# Patient Record
Sex: Female | Born: 1955
Health system: Southern US, Community
[De-identification: ages and names within clinical notes are randomized; demographics above are authoritative.]

## PROBLEM LIST (undated history)

## (undated) DIAGNOSIS — I493 Ventricular premature depolarization: Secondary | ICD-10-CM

## (undated) DIAGNOSIS — G4700 Insomnia, unspecified: Secondary | ICD-10-CM

## (undated) DIAGNOSIS — G43909 Migraine, unspecified, not intractable, without status migrainosus: Secondary | ICD-10-CM

## (undated) DIAGNOSIS — I471 Supraventricular tachycardia, unspecified: Secondary | ICD-10-CM

## (undated) DIAGNOSIS — K5190 Ulcerative colitis, unspecified, without complications: Secondary | ICD-10-CM

## (undated) DIAGNOSIS — I351 Nonrheumatic aortic (valve) insufficiency: Secondary | ICD-10-CM

## (undated) DIAGNOSIS — M810 Age-related osteoporosis without current pathological fracture: Secondary | ICD-10-CM

## (undated) HISTORY — DX: Ventricular premature depolarization: I49.3

## (undated) HISTORY — DX: Nonrheumatic aortic (valve) insufficiency: I35.1

## (undated) HISTORY — DX: Supraventricular tachycardia: I47.1

## (undated) HISTORY — PX: TUBAL LIGATION: SHX77

## (undated) HISTORY — DX: Supraventricular tachycardia, unspecified: I47.10

## (undated) HISTORY — DX: Insomnia, unspecified: G47.00

## (undated) HISTORY — DX: Migraine, unspecified, not intractable, without status migrainosus: G43.909

---

## 2013-02-24 ENCOUNTER — Ambulatory Visit (HOSPITAL_BASED_OUTPATIENT_CLINIC_OR_DEPARTMENT_OTHER)
Admission: RE | Admit: 2013-02-24 | Discharge: 2013-02-24 | Disposition: A | Discharge status: Home / Self Care | Payer: 59 | Source: Ambulatory Visit | Attending: Family Medicine | Admitting: Family Medicine

## 2013-02-24 ENCOUNTER — Other Ambulatory Visit (HOSPITAL_BASED_OUTPATIENT_CLINIC_OR_DEPARTMENT_OTHER): Payer: Self-pay | Admitting: Family Medicine

## 2013-02-24 DIAGNOSIS — R209 Unspecified disturbances of skin sensation: Secondary | ICD-10-CM | POA: Insufficient documentation

## 2013-02-24 DIAGNOSIS — M542 Cervicalgia: Secondary | ICD-10-CM | POA: Insufficient documentation

## 2013-06-18 ENCOUNTER — Other Ambulatory Visit: Payer: Self-pay | Admitting: Family Medicine

## 2013-06-18 ENCOUNTER — Other Ambulatory Visit: Payer: Self-pay

## 2013-06-18 DIAGNOSIS — M8580 Other specified disorders of bone density and structure, unspecified site: Secondary | ICD-10-CM

## 2013-06-18 DIAGNOSIS — Z1231 Encounter for screening mammogram for malignant neoplasm of breast: Secondary | ICD-10-CM

## 2013-07-09 ENCOUNTER — Ambulatory Visit: Payer: 59

## 2013-07-09 ENCOUNTER — Other Ambulatory Visit: Payer: 59

## 2013-07-22 ENCOUNTER — Ambulatory Visit
Admission: RE | Admit: 2013-07-22 | Discharge: 2013-07-22 | Disposition: A | Discharge status: Home / Self Care | Payer: 59 | Source: Ambulatory Visit | Attending: Family Medicine | Admitting: Family Medicine

## 2013-07-22 ENCOUNTER — Ambulatory Visit
Admission: RE | Admit: 2013-07-22 | Discharge: 2013-07-22 | Disposition: A | Discharge status: Home / Self Care | Payer: 59 | Source: Ambulatory Visit

## 2013-07-22 DIAGNOSIS — Z1231 Encounter for screening mammogram for malignant neoplasm of breast: Secondary | ICD-10-CM

## 2013-07-22 DIAGNOSIS — M8580 Other specified disorders of bone density and structure, unspecified site: Secondary | ICD-10-CM

## 2014-08-27 ENCOUNTER — Other Ambulatory Visit (HOSPITAL_BASED_OUTPATIENT_CLINIC_OR_DEPARTMENT_OTHER): Payer: Self-pay | Admitting: Family Medicine

## 2014-08-27 DIAGNOSIS — Z0000 Encounter for general adult medical examination without abnormal findings: Secondary | ICD-10-CM

## 2014-08-31 ENCOUNTER — Ambulatory Visit (HOSPITAL_BASED_OUTPATIENT_CLINIC_OR_DEPARTMENT_OTHER)
Admission: RE | Admit: 2014-08-31 | Discharge: 2014-08-31 | Disposition: A | Discharge status: Home / Self Care | Payer: 59 | Source: Ambulatory Visit | Attending: Family Medicine | Admitting: Family Medicine

## 2014-08-31 DIAGNOSIS — Z1231 Encounter for screening mammogram for malignant neoplasm of breast: Secondary | ICD-10-CM | POA: Insufficient documentation

## 2014-08-31 DIAGNOSIS — Z0000 Encounter for general adult medical examination without abnormal findings: Secondary | ICD-10-CM

## 2015-08-11 ENCOUNTER — Other Ambulatory Visit (HOSPITAL_BASED_OUTPATIENT_CLINIC_OR_DEPARTMENT_OTHER): Payer: Self-pay | Admitting: Family Medicine

## 2015-08-11 DIAGNOSIS — Z139 Encounter for screening, unspecified: Secondary | ICD-10-CM

## 2015-09-06 ENCOUNTER — Ambulatory Visit (HOSPITAL_BASED_OUTPATIENT_CLINIC_OR_DEPARTMENT_OTHER)
Admission: RE | Admit: 2015-09-06 | Discharge: 2015-09-06 | Disposition: A | Discharge status: Home / Self Care | Payer: 59 | Source: Ambulatory Visit | Attending: Family Medicine | Admitting: Family Medicine

## 2015-09-06 DIAGNOSIS — Z1231 Encounter for screening mammogram for malignant neoplasm of breast: Secondary | ICD-10-CM | POA: Insufficient documentation

## 2015-09-06 DIAGNOSIS — Z139 Encounter for screening, unspecified: Secondary | ICD-10-CM

## 2016-01-20 MED FILL — ALENDRONATE NA 70 MG TAB: 70 | 84 days supply | Qty: 12 | Fill #3

## 2016-01-26 MED FILL — valACYclovir HCL 1 GM TABS: 1 | 30 days supply | Qty: 30 | Fill #1

## 2016-04-13 MED FILL — ALENDRONATE NA 70 MG TAB: 70 | 84 days supply | Qty: 12 | Fill #0

## 2016-05-18 DIAGNOSIS — Z23 Encounter for immunization: Secondary | ICD-10-CM | POA: Diagnosis not present

## 2016-05-18 MED FILL — ATOVAQUONE-PROGUANIL 250-10: 250-100 | 19 days supply | Qty: 19 | Fill #0

## 2016-05-29 DIAGNOSIS — Z0000 Encounter for general adult medical examination without abnormal findings: Secondary | ICD-10-CM | POA: Diagnosis not present

## 2016-06-06 DIAGNOSIS — N393 Stress incontinence (female) (male): Secondary | ICD-10-CM | POA: Insufficient documentation

## 2016-06-06 DIAGNOSIS — J309 Allergic rhinitis, unspecified: Secondary | ICD-10-CM | POA: Insufficient documentation

## 2016-06-06 DIAGNOSIS — E782 Mixed hyperlipidemia: Secondary | ICD-10-CM | POA: Diagnosis not present

## 2016-06-06 DIAGNOSIS — E559 Vitamin D deficiency, unspecified: Secondary | ICD-10-CM | POA: Insufficient documentation

## 2016-06-06 DIAGNOSIS — Z0000 Encounter for general adult medical examination without abnormal findings: Secondary | ICD-10-CM | POA: Diagnosis not present

## 2016-06-06 DIAGNOSIS — M810 Age-related osteoporosis without current pathological fracture: Secondary | ICD-10-CM | POA: Insufficient documentation

## 2016-06-06 DIAGNOSIS — K5100 Ulcerative (chronic) pancolitis without complications: Secondary | ICD-10-CM | POA: Insufficient documentation

## 2016-06-06 DIAGNOSIS — K5190 Ulcerative colitis, unspecified, without complications: Secondary | ICD-10-CM | POA: Insufficient documentation

## 2016-06-22 ENCOUNTER — Other Ambulatory Visit (HOSPITAL_BASED_OUTPATIENT_CLINIC_OR_DEPARTMENT_OTHER): Payer: Self-pay | Admitting: Family Medicine

## 2016-06-22 DIAGNOSIS — M810 Age-related osteoporosis without current pathological fracture: Secondary | ICD-10-CM

## 2016-06-22 DIAGNOSIS — Z1231 Encounter for screening mammogram for malignant neoplasm of breast: Secondary | ICD-10-CM

## 2016-07-10 MED FILL — ALENDRONATE NA 70 MG TAB: 70 | 84 days supply | Qty: 12 | Fill #1

## 2016-08-02 DIAGNOSIS — Z1211 Encounter for screening for malignant neoplasm of colon: Secondary | ICD-10-CM | POA: Diagnosis not present

## 2016-08-02 DIAGNOSIS — K5100 Ulcerative (chronic) pancolitis without complications: Secondary | ICD-10-CM | POA: Diagnosis not present

## 2016-08-08 MED FILL — MESALAMINE DR 1.2 GM TABLET: 1.2 | 30 days supply | Qty: 60 | Fill #0

## 2016-08-09 MED FILL — valACYclovir HCL 1 GM TABS: 1 | 30 days supply | Qty: 30 | Fill #0

## 2016-09-07 ENCOUNTER — Other Ambulatory Visit (HOSPITAL_BASED_OUTPATIENT_CLINIC_OR_DEPARTMENT_OTHER): Payer: 59

## 2016-09-07 ENCOUNTER — Ambulatory Visit (HOSPITAL_BASED_OUTPATIENT_CLINIC_OR_DEPARTMENT_OTHER): Payer: 59

## 2016-09-08 ENCOUNTER — Other Ambulatory Visit (HOSPITAL_BASED_OUTPATIENT_CLINIC_OR_DEPARTMENT_OTHER): Payer: 59

## 2016-09-08 ENCOUNTER — Ambulatory Visit (HOSPITAL_BASED_OUTPATIENT_CLINIC_OR_DEPARTMENT_OTHER): Payer: 59

## 2016-09-11 ENCOUNTER — Ambulatory Visit (HOSPITAL_BASED_OUTPATIENT_CLINIC_OR_DEPARTMENT_OTHER)
Admission: RE | Admit: 2016-09-11 | Discharge: 2016-09-11 | Disposition: A | Discharge status: Home / Self Care | Payer: 59 | Source: Ambulatory Visit | Attending: Family Medicine | Admitting: Family Medicine

## 2016-09-11 DIAGNOSIS — M85852 Other specified disorders of bone density and structure, left thigh: Secondary | ICD-10-CM | POA: Insufficient documentation

## 2016-09-11 DIAGNOSIS — Z780 Asymptomatic menopausal state: Secondary | ICD-10-CM | POA: Insufficient documentation

## 2016-09-11 DIAGNOSIS — Z1231 Encounter for screening mammogram for malignant neoplasm of breast: Secondary | ICD-10-CM | POA: Diagnosis not present

## 2016-09-11 DIAGNOSIS — Z87891 Personal history of nicotine dependence: Secondary | ICD-10-CM | POA: Diagnosis not present

## 2016-09-11 DIAGNOSIS — M8588 Other specified disorders of bone density and structure, other site: Secondary | ICD-10-CM | POA: Diagnosis not present

## 2016-09-11 DIAGNOSIS — M810 Age-related osteoporosis without current pathological fracture: Secondary | ICD-10-CM | POA: Diagnosis not present

## 2016-09-13 MED FILL — MESALAMINE DR 1.2 GM TABLET: 1.2 | 30 days supply | Qty: 60 | Fill #1

## 2016-10-04 DIAGNOSIS — Z23 Encounter for immunization: Secondary | ICD-10-CM | POA: Diagnosis not present

## 2016-10-05 DIAGNOSIS — H524 Presbyopia: Secondary | ICD-10-CM | POA: Diagnosis not present

## 2016-10-05 DIAGNOSIS — H52223 Regular astigmatism, bilateral: Secondary | ICD-10-CM | POA: Diagnosis not present

## 2016-10-09 MED FILL — SCOPOLAMINE 1 MG/3 DAY PATC: 1 | 30 days supply | Qty: 10 | Fill #0

## 2016-10-09 MED FILL — ALENDRONATE NA 70 MG TAB: 70 | 84 days supply | Qty: 12 | Fill #2

## 2016-10-19 MED FILL — MESALAMINE DR 1.2G TABLET: 1.2 | 30 days supply | Qty: 60 | Fill #2

## 2016-11-14 MED FILL — MESALAMINE DR 1.2G TABLET: 1.2 | 90 days supply | Qty: 180 | Fill #0

## 2017-01-17 MED FILL — ALENDRONATE NA 70 MG TAB: 70 | 84 days supply | Qty: 12 | Fill #3

## 2017-04-17 MED FILL — ALENDRONATE NA 70 MG TAB: 70 | 84 days supply | Qty: 12 | Fill #0

## 2017-04-26 MED FILL — SUPREP BOWEL PREP KIT: 17.5-3.13-1 | 2 days supply | Qty: 354 | Fill #0

## 2017-05-07 DIAGNOSIS — K639 Disease of intestine, unspecified: Secondary | ICD-10-CM | POA: Diagnosis not present

## 2017-05-07 DIAGNOSIS — K6389 Other specified diseases of intestine: Secondary | ICD-10-CM | POA: Diagnosis not present

## 2017-05-07 DIAGNOSIS — K648 Other hemorrhoids: Secondary | ICD-10-CM | POA: Diagnosis not present

## 2017-05-07 DIAGNOSIS — K629 Disease of anus and rectum, unspecified: Secondary | ICD-10-CM | POA: Diagnosis not present

## 2017-05-07 DIAGNOSIS — Z8371 Family history of colonic polyps: Secondary | ICD-10-CM | POA: Diagnosis not present

## 2017-05-07 DIAGNOSIS — K6289 Other specified diseases of anus and rectum: Secondary | ICD-10-CM | POA: Diagnosis not present

## 2017-05-07 DIAGNOSIS — K5730 Diverticulosis of large intestine without perforation or abscess without bleeding: Secondary | ICD-10-CM | POA: Diagnosis not present

## 2017-05-07 DIAGNOSIS — K5190 Ulcerative colitis, unspecified, without complications: Secondary | ICD-10-CM | POA: Diagnosis not present

## 2017-05-07 DIAGNOSIS — Z1211 Encounter for screening for malignant neoplasm of colon: Secondary | ICD-10-CM | POA: Diagnosis not present

## 2017-06-04 DIAGNOSIS — Z0000 Encounter for general adult medical examination without abnormal findings: Secondary | ICD-10-CM | POA: Diagnosis not present

## 2017-06-11 DIAGNOSIS — Z79899 Other long term (current) drug therapy: Secondary | ICD-10-CM | POA: Diagnosis not present

## 2017-06-11 DIAGNOSIS — Z124 Encounter for screening for malignant neoplasm of cervix: Secondary | ICD-10-CM | POA: Diagnosis not present

## 2017-06-11 DIAGNOSIS — Z1151 Encounter for screening for human papillomavirus (HPV): Secondary | ICD-10-CM | POA: Diagnosis not present

## 2017-06-11 DIAGNOSIS — E559 Vitamin D deficiency, unspecified: Secondary | ICD-10-CM | POA: Diagnosis not present

## 2017-06-11 DIAGNOSIS — B009 Herpesviral infection, unspecified: Secondary | ICD-10-CM | POA: Diagnosis not present

## 2017-06-11 DIAGNOSIS — M810 Age-related osteoporosis without current pathological fracture: Secondary | ICD-10-CM | POA: Diagnosis not present

## 2017-06-11 DIAGNOSIS — E782 Mixed hyperlipidemia: Secondary | ICD-10-CM | POA: Diagnosis not present

## 2017-06-11 DIAGNOSIS — M25512 Pain in left shoulder: Secondary | ICD-10-CM | POA: Diagnosis not present

## 2017-06-11 DIAGNOSIS — K5100 Ulcerative (chronic) pancolitis without complications: Secondary | ICD-10-CM | POA: Diagnosis not present

## 2017-06-11 DIAGNOSIS — Z0000 Encounter for general adult medical examination without abnormal findings: Secondary | ICD-10-CM | POA: Diagnosis not present

## 2017-07-16 MED FILL — ALENDRONATE NA 70 MG TAB: 70 | 84 days supply | Qty: 12 | Fill #1

## 2017-08-29 ENCOUNTER — Other Ambulatory Visit (HOSPITAL_BASED_OUTPATIENT_CLINIC_OR_DEPARTMENT_OTHER): Payer: Self-pay | Admitting: Family Medicine

## 2017-08-29 DIAGNOSIS — Z1231 Encounter for screening mammogram for malignant neoplasm of breast: Secondary | ICD-10-CM

## 2017-09-13 ENCOUNTER — Ambulatory Visit (HOSPITAL_BASED_OUTPATIENT_CLINIC_OR_DEPARTMENT_OTHER)
Admission: RE | Admit: 2017-09-13 | Discharge: 2017-09-13 | Disposition: A | Discharge status: Home / Self Care | Payer: 59 | Source: Ambulatory Visit | Attending: Family Medicine | Admitting: Family Medicine

## 2017-09-13 DIAGNOSIS — Z1231 Encounter for screening mammogram for malignant neoplasm of breast: Secondary | ICD-10-CM | POA: Insufficient documentation

## 2017-09-14 ENCOUNTER — Emergency Department (HOSPITAL_BASED_OUTPATIENT_CLINIC_OR_DEPARTMENT_OTHER): Payer: 59

## 2017-09-14 ENCOUNTER — Observation Stay (HOSPITAL_BASED_OUTPATIENT_CLINIC_OR_DEPARTMENT_OTHER): Payer: 59

## 2017-09-14 ENCOUNTER — Encounter (HOSPITAL_BASED_OUTPATIENT_CLINIC_OR_DEPARTMENT_OTHER): Payer: Self-pay | Admitting: *Deleted

## 2017-09-14 ENCOUNTER — Observation Stay (HOSPITAL_COMMUNITY): Payer: 59

## 2017-09-14 ENCOUNTER — Observation Stay (HOSPITAL_BASED_OUTPATIENT_CLINIC_OR_DEPARTMENT_OTHER)
Admission: EM | Admit: 2017-09-14 | Discharge: 2017-09-14 | Disposition: A | Discharge status: Home / Self Care | Payer: 59 | Attending: Family Medicine | Admitting: Family Medicine

## 2017-09-14 ENCOUNTER — Other Ambulatory Visit: Payer: Self-pay | Admitting: Cardiology

## 2017-09-14 DIAGNOSIS — Z7982 Long term (current) use of aspirin: Secondary | ICD-10-CM | POA: Insufficient documentation

## 2017-09-14 DIAGNOSIS — G459 Transient cerebral ischemic attack, unspecified: Secondary | ICD-10-CM

## 2017-09-14 DIAGNOSIS — R7303 Prediabetes: Secondary | ICD-10-CM | POA: Insufficient documentation

## 2017-09-14 DIAGNOSIS — Z79899 Other long term (current) drug therapy: Secondary | ICD-10-CM | POA: Diagnosis not present

## 2017-09-14 DIAGNOSIS — Z882 Allergy status to sulfonamides status: Secondary | ICD-10-CM | POA: Diagnosis not present

## 2017-09-14 DIAGNOSIS — E785 Hyperlipidemia, unspecified: Secondary | ICD-10-CM

## 2017-09-14 DIAGNOSIS — M4802 Spinal stenosis, cervical region: Secondary | ICD-10-CM | POA: Insufficient documentation

## 2017-09-14 DIAGNOSIS — E876 Hypokalemia: Secondary | ICD-10-CM | POA: Diagnosis not present

## 2017-09-14 DIAGNOSIS — I672 Cerebral atherosclerosis: Secondary | ICD-10-CM | POA: Insufficient documentation

## 2017-09-14 DIAGNOSIS — M810 Age-related osteoporosis without current pathological fracture: Secondary | ICD-10-CM | POA: Diagnosis not present

## 2017-09-14 DIAGNOSIS — I6521 Occlusion and stenosis of right carotid artery: Secondary | ICD-10-CM | POA: Diagnosis not present

## 2017-09-14 DIAGNOSIS — G43809 Other migraine, not intractable, without status migrainosus: Secondary | ICD-10-CM | POA: Insufficient documentation

## 2017-09-14 DIAGNOSIS — Z823 Family history of stroke: Secondary | ICD-10-CM | POA: Insufficient documentation

## 2017-09-14 DIAGNOSIS — H538 Other visual disturbances: Secondary | ICD-10-CM | POA: Diagnosis not present

## 2017-09-14 DIAGNOSIS — Z87891 Personal history of nicotine dependence: Secondary | ICD-10-CM | POA: Insufficient documentation

## 2017-09-14 DIAGNOSIS — I351 Nonrheumatic aortic (valve) insufficiency: Secondary | ICD-10-CM

## 2017-09-14 DIAGNOSIS — R4701 Aphasia: Secondary | ICD-10-CM

## 2017-09-14 DIAGNOSIS — Z8719 Personal history of other diseases of the digestive system: Secondary | ICD-10-CM | POA: Insufficient documentation

## 2017-09-14 DIAGNOSIS — R410 Disorientation, unspecified: Secondary | ICD-10-CM | POA: Diagnosis not present

## 2017-09-14 HISTORY — DX: Ulcerative colitis, unspecified, without complications: K51.90

## 2017-09-14 HISTORY — DX: Age-related osteoporosis without current pathological fracture: M81.0

## 2017-09-14 LAB — CBC
HCT: 39.8 % (ref 36.0–46.0)
HCT: 41 % (ref 36.0–46.0)
HEMOGLOBIN: 13.4 g/dL (ref 12.0–15.0)
HEMOGLOBIN: 13.6 g/dL (ref 12.0–15.0)
MCH: 30.5 pg (ref 26.0–34.0)
MCH: 30.6 pg (ref 26.0–34.0)
MCHC: 33.2 g/dL (ref 30.0–36.0)
MCHC: 33.7 g/dL (ref 30.0–36.0)
MCV: 90.7 fL (ref 78.0–100.0)
MCV: 92.3 fL (ref 78.0–100.0)
PLATELETS: 198 10*3/uL (ref 150–400)
Platelets: 220 10*3/uL (ref 150–400)
RBC: 4.39 MIL/uL (ref 3.87–5.11)
RBC: 4.44 MIL/uL (ref 3.87–5.11)
RDW: 13.2 % (ref 11.5–15.5)
RDW: 13.4 % (ref 11.5–15.5)
WBC: 5.7 10*3/uL (ref 4.0–10.5)
WBC: 7.6 10*3/uL (ref 4.0–10.5)

## 2017-09-14 LAB — RAPID URINE DRUG SCREEN, HOSP PERFORMED
AMPHETAMINES: NOT DETECTED
BENZODIAZEPINES: NOT DETECTED
Barbiturates: NOT DETECTED
Cocaine: NOT DETECTED
OPIATES: NOT DETECTED
TETRAHYDROCANNABINOL: NOT DETECTED

## 2017-09-14 LAB — COMPREHENSIVE METABOLIC PANEL
ALK PHOS: 45 U/L (ref 38–126)
ALT: 22 U/L (ref 14–54)
AST: 29 U/L (ref 15–41)
Albumin: 5 g/dL (ref 3.5–5.0)
Anion gap: 8 (ref 5–15)
BUN: 15 mg/dL (ref 6–20)
CHLORIDE: 101 mmol/L (ref 101–111)
CO2: 28 mmol/L (ref 22–32)
Calcium: 9.6 mg/dL (ref 8.9–10.3)
Creatinine, Ser: 0.61 mg/dL (ref 0.44–1.00)
GFR calc Af Amer: >60 mL/min (ref >60)
GFR calc non Af Amer: >60 mL/min (ref >60)
GLUCOSE: 103 mg/dL — AB (ref 65–99)
POTASSIUM: 3.3 mmol/L — AB (ref 3.5–5.1)
Sodium: 137 mmol/L (ref 135–145)
Total Bilirubin: 0.7 mg/dL (ref 0.3–1.2)
Total Protein: 7.3 g/dL (ref 6.5–8.1)

## 2017-09-14 LAB — LIPID PANEL
CHOLESTEROL: 196 mg/dL (ref 0–200)
HDL: 76 mg/dL (ref >40)
LDL Cholesterol: 107 mg/dL — ABNORMAL HIGH (ref 0–99)
TRIGLYCERIDES: 64 mg/dL (ref <150)
Total CHOL/HDL Ratio: 2.6 RATIO
VLDL: 13 mg/dL (ref 0–40)

## 2017-09-14 LAB — DIFFERENTIAL
Basophils Absolute: 0 10*3/uL (ref 0.0–0.1)
Basophils Relative: 0 %
EOS PCT: 1 %
Eosinophils Absolute: 0.1 10*3/uL (ref 0.0–0.7)
LYMPHS ABS: 3.9 10*3/uL (ref 0.7–4.0)
LYMPHS PCT: 51 %
MONOS PCT: 6 %
Monocytes Absolute: 0.5 10*3/uL (ref 0.1–1.0)
NEUTROS PCT: 42 %
Neutro Abs: 3.2 10*3/uL (ref 1.7–7.7)

## 2017-09-14 LAB — PROTIME-INR
INR: 0.93
Prothrombin Time: 12.4 seconds (ref 11.4–15.2)

## 2017-09-14 LAB — URINALYSIS, ROUTINE W REFLEX MICROSCOPIC
BILIRUBIN URINE: NEGATIVE
Glucose, UA: NEGATIVE mg/dL
HGB URINE DIPSTICK: NEGATIVE
KETONES UR: NEGATIVE mg/dL
NITRITE: NEGATIVE
PH: 7 (ref 5.0–8.0)
Protein, ur: NEGATIVE mg/dL

## 2017-09-14 LAB — HEMOGLOBIN A1C
HEMOGLOBIN A1C: 5.9 % — AB (ref 4.8–5.6)
MEAN PLASMA GLUCOSE: 122.63 mg/dL

## 2017-09-14 LAB — TROPONIN I: Troponin I: <0.03 ng/mL (ref <0.03)

## 2017-09-14 LAB — URINALYSIS, MICROSCOPIC (REFLEX): RBC / HPF: NONE SEEN RBC/hpf (ref 0–5)

## 2017-09-14 LAB — CREATININE, SERUM
Creatinine, Ser: 0.6 mg/dL (ref 0.44–1.00)
GFR calc non Af Amer: >60 mL/min (ref >60)

## 2017-09-14 LAB — ECHOCARDIOGRAM COMPLETE
Height: 62.5 in
WEIGHTICAEL: 1664 [oz_av]

## 2017-09-14 LAB — ETHANOL: Alcohol, Ethyl (B): <10 mg/dL (ref <10)

## 2017-09-14 LAB — HIV ANTIBODY (ROUTINE TESTING W REFLEX): HIV Screen 4th Generation wRfx: NONREACTIVE

## 2017-09-14 LAB — TSH: TSH: 0.947 u[IU]/mL (ref 0.350–4.500)

## 2017-09-14 LAB — APTT: aPTT: 33 seconds (ref 24–36)

## 2017-09-14 MED ORDER — ACETAMINOPHEN 325 MG PO TABS: 650.0000 mg | ORAL_TABLET | ORAL | Status: DC | PRN

## 2017-09-14 MED ORDER — ENOXAPARIN SODIUM 30 MG/0.3ML ~~LOC~~ SOLN
30.0000 mg | SUBCUTANEOUS | Status: DC
  Filled 2017-09-14: qty 0.3

## 2017-09-14 MED ORDER — ASPIRIN 300 MG RE SUPP: 300.0000 mg | Freq: Every day | RECTAL | Status: DC

## 2017-09-14 MED ORDER — ACETAMINOPHEN 160 MG/5ML PO SOLN: 650.0000 mg | ORAL | Status: DC | PRN

## 2017-09-14 MED ORDER — POTASSIUM CHLORIDE CRYS ER 20 MEQ PO TBCR
40.0000 meq | EXTENDED_RELEASE_TABLET | Freq: Once | ORAL | Status: AC
  Administered 2017-09-14: 40 meq via ORAL
  Filled 2017-09-14: qty 2

## 2017-09-14 MED ORDER — ASPIRIN EC 81 MG PO TBEC: 81.0000 mg | DELAYED_RELEASE_TABLET | Freq: Every day | ORAL | 0 refills | Status: AC

## 2017-09-14 MED ORDER — ATORVASTATIN CALCIUM 20 MG PO TABS: 20.0000 mg | ORAL_TABLET | Freq: Every day | ORAL | Status: DC

## 2017-09-14 MED ORDER — STROKE: EARLY STAGES OF RECOVERY BOOK
Freq: Once | Status: DC
  Filled 2017-09-14: qty 1

## 2017-09-14 MED ORDER — ACETAMINOPHEN 650 MG RE SUPP: 650.0000 mg | RECTAL | Status: DC | PRN

## 2017-09-14 MED ORDER — SODIUM CHLORIDE 0.9 % IV SOLN
INTRAVENOUS | Status: DC
  Administered 2017-09-14: 06:00:00 via INTRAVENOUS

## 2017-09-14 MED ORDER — ASPIRIN 325 MG PO TABS
325.0000 mg | ORAL_TABLET | Freq: Every day | ORAL | Status: DC
  Administered 2017-09-14: 325 mg via ORAL
  Filled 2017-09-14: qty 1

## 2017-09-14 MED ORDER — IOPAMIDOL (ISOVUE-370) INJECTION 76%
100.0000 mL | Freq: Once | INTRAVENOUS | Status: AC | PRN
  Administered 2017-09-14: 100 mL via INTRAVENOUS

## 2017-09-14 NOTE — Progress Notes (Signed)
PROGRESS NOTE    Samantha BorneRuth M Robeck  WUJ:[id number] DOB: 05/03/1956 DOA: 09/14/2017 PCP: Bosie Closice, Kathleen M, MD    Brief Narrative:  Samantha Matthews is Samantha Matthews 61 y.o. 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  This lasted for around 15 minutes.  Code stroke was called.  CT head was unremarkable.  This was followed by CT angiogram of the head and neck which also did not show any large vessel occlusion.  [facility]stroke workup.   Assessment & Plan:   Principal Problem:   TIA (transient ischemic attack)   1. TIA - sudden onset word finding difficulty and problems reading now resolved.  Sx c/w TIA.   1. MRI brain with chronic small vessel ischemic disease 2. 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  Also mod stenosis R P2 segment and mild cerebral artery atherosclerosis 3. Echo pending 4. Normal EEG 5. A1c 5.9, LDL 107 6. Neuro c/s, appreciate recs 1. ASA 325 at discharge 2. Atorvastatin 20 mg 3. Outpatient f/u neurology Darrol Angel(Carolyn Martin in 6 weeks) 4. Event monitor 30 days 2. Prediabetes: A1c 5.9, f/u with PCP 3. History of ulcerative colitis but off medications since patient's last colonoscopy was normal as per the patient and presently asymptomatic. 4. C3-4 and C4-5 neural foraminal narrowing per neurology. 5. Hypokalemia: replete, follow up mg    DVT prophylaxis: lovenox Code Status: full  Family Communication: husband at bedsied Disposition Plan: pending studies  Consultants:   neurology  Procedures: (Don't include imaging studies which can be auto populated. Include things that cannot be auto populated i.e. Echo,  Carotid and venous dopplers, Foley, Bipap, HD, tubes/drains, wound vac, central lines etc)  EEG normal  Echo pending  Antimicrobials: (specify start and planned stop date. Auto populated tables are space occupying and do not give end dates)  none    Subjective: Feeling normal. Notes sx started last night around MN.  30 min, difficulty with reading and word finding.  She's currently asx.   Objective: Vitals:   09/14/17 1030 09/14/17 1100 09/14/17 1200 09/14/17 1300  BP:  (!) 143/79 133/83 124/78  Pulse: 64 65 66 (!) 59  Resp: 20 18 20 17   Temp:      SpO2: 97% 97% 99% 97%  Weight:      Height:       No intake or output data in the 24 hours ending 09/14/17 1440 Filed Weights   09/14/17 0024  Weight: 47.2 kg (104 lb)    Examination:  General: No acute distress. Cardiovascular: Heart sounds show Alcide Memoli regular rate, and rhythm. No gallops or rubs. No murmurs. No JVD. Lungs: Clear to auscultation bilaterally with good air movement. No rales, rhonchi or wheezes. Abdomen: Soft, nontender, nondistended with normal active bowel sounds. No masses. No hepatosplenomegaly. Neurological: Alert and oriented 3. Moves all extremities 4 with equal strength. Cranial nerves II through XII intact.  FNF, heel to shin intact.  Skin: Warm and dry. No rashes or lesions. Extremities: No clubbing or cyanosis. No edema. Pedal pulses 2+. Psychiatric: Mood and affect are normal. Insight and judgment are appropriate.   Data Reviewed: I have personally reviewed following labs and imaging studies  CBC:  Recent Labs Lab 09/14/17 0018 09/14/17 0508  WBC 7.6 5.7  NEUTROABS 3.2  --   HGB 13.6 13.4  HCT 41.0 39.8  MCV 92.3 90.7  PLT 220 198   Basic Metabolic Panel:  Recent Labs Lab 09/14/17 0018 09/14/17 0508  NA 137  --   K 3.3*  --   CL 101  --   CO2 28  --   GLUCOSE 103*  --   BUN 15  --   CREATININE 0.61 0.60  CALCIUM 9.6  --    GFR: Estimated Creatinine Clearance: 55 mL/min (by  C-G formula based on SCr of 0.6 mg/dL). Liver Function Tests:  Recent Labs Lab 09/14/17 0018  AST 29  ALT 22  ALKPHOS 45  BILITOT 0.7  PROT 7.3  ALBUMIN 5.0   No results for input(s): LIPASE, AMYLASE in the last 168 hours. No results for input(s): AMMONIA in the last 168 hours. Coagulation Profile:  Recent Labs Lab 09/14/17 0018  INR 0.93   Cardiac Enzymes:  Recent Labs Lab 09/14/17 0018  TROPONINI <0.03   BNP (last 3 results) No results for input(s): PROBNP in the last 8760 hours. HbA1C:  Recent Labs  09/14/17 0508  HGBA1C 5.9*   CBG: No results for input(s): GLUCAP in the last 168 hours. Lipid Profile:  Recent Labs  09/14/17 0508  CHOL 196  HDL 76  LDLCALC 107*  TRIG 64  CHOLHDL 2.6   Thyroid Function Tests:  Recent Labs  09/14/17 1216  TSH 0.947   Anemia Panel: No results for input(s): VITAMINB12, FOLATE, FERRITIN, TIBC, IRON, RETICCTPCT in the last 72 hours. Sepsis Labs: No results for input(s): PROCALCITON, LATICACIDVEN in the last 168 hours.  No results found for this or any previous visit (from the past 240 hour(s)).       Radiology Studies: Ct Angio Head W Or Wo Contrast  Result Date: 09/14/2017 CLINICAL DATA:  Code stroke. Periodic confusion and loss of words, now better. EXAM: CT ANGIOGRAPHY HEAD AND NECK TECHNIQUE: Multidetector CT imaging of the head and neck was performed using the standard protocol during bolus administration of intravenous contrast. Multiplanar CT image reconstructions and MIPs were obtained to evaluate the vascular anatomy. Carotid stenosis measurements (when applicable) are obtained utilizing NASCET criteria, using the distal internal carotid diameter as the denominator. CONTRAST:  100 cc Isovue 370 COMPARISON:  None. FINDINGS: CT HEAD FINDINGS BRAIN: No intraparenchymal hemorrhage, mass effect nor midline shift. The ventricles and sulci are normal for age. Minimal supratentorial white matter hypodensities  within normal range for patient's age, though non-specific are most compatible with chronic small vessel ischemic disease. No acute large vascular territory infarcts. No abnormal extra-axial fluid collections. Basal cisterns are patent. VASCULAR: Mild-to-moderate calcific atherosclerosis of the carotid siphons. SKULL: No skull fracture. No significant scalp soft tissue swelling. SINUSES/ORBITS: Small LEFT maxillary mucosal retention cyst. Mastoid air cells are well aerated.The included ocular globes and orbital contents are non-suspicious. OTHER: None. CTA NECK FINDINGS AORTIC ARCH: Normal appearance of the thoracic arch, normal branch pattern. The origins of the innominate, left Common carotid artery and subclavian artery are widely patent. RIGHT CAROTID SYSTEM: Common carotid artery is widely patent, coursing in Demorio Seeley straight line fashion. Normal appearance of the carotid bifurcation without hemodynamically significant stenosis by NASCET criteria, minimal calcific atherosclerosis. Normal appearance of the internal carotid artery. LEFT CAROTID SYSTEM: Common carotid artery is widely patent, coursing in Abdurahman Rugg straight line fashion. Normal appearance of the carotid bifurcation without hemodynamically significant stenosis by  NASCET criteria, minimal calcific atherosclerosis. Normal appearance of the internal carotid artery. VERTEBRAL ARTERIES:Codominant vertebral artery's. Normal appearance of the vertebral arteries, which appear widely patent. SKELETON: No acute osseous process though bone windows have not been submitted. Cervical spondylosis and facet arthropathy. Severe RIGHT C3-4, severe LEFT C4-5 neural foraminal narrowing. OTHER NECK: Soft tissues of the neck are nonacute though, not tailored for evaluation. UPPER CHEST: Included lung apices are clear. No superior mediastinal lymphadenopathy. CTA HEAD FINDINGS ANTERIOR CIRCULATION: Patent cervical internal carotid arteries, petrous, cavernous and supra clinoid internal  carotid arteries. Widely patent anterior communicating artery. Patent anterior and middle cerebral arteries, mild luminal irregularity. No large vessel occlusion, significant stenosis, contrast extravasation or aneurysm. POSTERIOR CIRCULATION: Patent vertebral arteries, vertebrobasilar junction and basilar artery, as well as main branch vessels. Patent posterior cerebral arteries. Fetal origin LEFT posterior cerebral artery. Mild luminal irregularity posterior cerebral artery's. Moderate stenosis RIGHT P2 segment. No large vessel occlusion, significant stenosis, contrast extravasation or aneurysm. VENOUS SINUSES: Major dural venous sinuses are patent though not tailored for evaluation on this angiographic examination. ANATOMIC VARIANTS: None. DELAYED PHASE: No abnormal intracranial enhancement. MIP images reviewed. IMPRESSION: CT HEAD: 1. Negative CT HEAD with and without contrast for age. CTA NECK: 1. Minimal atherosclerosis without hemodynamically significant stenosis or acute vascular process. 2. Severe C3-4 and C4-5 neural foraminal narrowing. CTA HEAD: 1. No emergent large vessel occlusion or severe stenosis. 2. Moderate stenosis RIGHT P2 segment. Mild cerebral artery atherosclerosis. Critical Value/emergent results were called by telephone at the time of interpretation on 09/14/2017 at 1:30 am to Dr. Paula Libra , who verbally acknowledged these results. Electronically Signed   By: Awilda Metro M.D.   On: 09/14/2017 01:36   Ct Angio Neck W And/or Wo Contrast  Result Date: 09/14/2017 CLINICAL DATA:  Code stroke. Periodic confusion and loss of words, now better. EXAM: CT ANGIOGRAPHY HEAD AND NECK TECHNIQUE: Multidetector CT imaging of the head and neck was performed using the standard protocol during bolus administration of intravenous contrast. Multiplanar CT image reconstructions and MIPs were obtained to evaluate the vascular anatomy. Carotid stenosis measurements (when applicable) are obtained  utilizing NASCET criteria, using the distal internal carotid diameter as the denominator. CONTRAST:  100 cc Isovue 370 COMPARISON:  None. FINDINGS: CT HEAD FINDINGS BRAIN: No intraparenchymal hemorrhage, mass effect nor midline shift. The ventricles and sulci are normal for age. Minimal supratentorial white matter hypodensities within normal range for patient's age, though non-specific are most compatible with chronic small vessel ischemic disease. No acute large vascular territory infarcts. No abnormal extra-axial fluid collections. Basal cisterns are patent. VASCULAR: Mild-to-moderate calcific atherosclerosis of the carotid siphons. SKULL: No skull fracture. No significant scalp soft tissue swelling. SINUSES/ORBITS: Small LEFT maxillary mucosal retention cyst. Mastoid air cells are well aerated.The included ocular globes and orbital contents are non-suspicious. OTHER: None. CTA NECK FINDINGS AORTIC ARCH: Normal appearance of the thoracic arch, normal branch pattern. The origins of the innominate, left Common carotid artery and subclavian artery are widely patent. RIGHT CAROTID SYSTEM: Common carotid artery is widely patent, coursing in Layaan Mott straight line fashion. Normal appearance of the carotid bifurcation without hemodynamically significant stenosis by NASCET criteria, minimal calcific atherosclerosis. Normal appearance of the internal carotid artery. LEFT CAROTID SYSTEM: Common carotid artery is widely patent, coursing in Keryl Gholson straight line fashion. Normal appearance of the carotid bifurcation without hemodynamically significant stenosis by NASCET criteria, minimal calcific atherosclerosis. Normal appearance of the internal carotid artery. VERTEBRAL ARTERIES:Codominant vertebral artery's. Normal appearance of the vertebral  arteries, which appear widely patent. SKELETON: No acute osseous process though bone windows have not been submitted. Cervical spondylosis and facet arthropathy. Severe RIGHT C3-4, severe LEFT C4-5  neural foraminal narrowing. OTHER NECK: Soft tissues of the neck are nonacute though, not tailored for evaluation. UPPER CHEST: Included lung apices are clear. No superior mediastinal lymphadenopathy. CTA HEAD FINDINGS ANTERIOR CIRCULATION: Patent cervical internal carotid arteries, petrous, cavernous and supra clinoid internal carotid arteries. Widely patent anterior communicating artery. Patent anterior and middle cerebral arteries, mild luminal irregularity. No large vessel occlusion, significant stenosis, contrast extravasation or aneurysm. POSTERIOR CIRCULATION: Patent vertebral arteries, vertebrobasilar junction and basilar artery, as well as main branch vessels. Patent posterior cerebral arteries. Fetal origin LEFT posterior cerebral artery. Mild luminal irregularity posterior cerebral artery's. Moderate stenosis RIGHT P2 segment. No large vessel occlusion, significant stenosis, contrast extravasation or aneurysm. VENOUS SINUSES: Major dural venous sinuses are patent though not tailored for evaluation on this angiographic examination. ANATOMIC VARIANTS: None. DELAYED PHASE: No abnormal intracranial enhancement. MIP images reviewed. IMPRESSION: CT HEAD: 1. Negative CT HEAD with and without contrast for age. CTA NECK: 1. Minimal atherosclerosis without hemodynamically significant stenosis or acute vascular process. 2. Severe C3-4 and C4-5 neural foraminal narrowing. CTA HEAD: 1. No emergent large vessel occlusion or severe stenosis. 2. Moderate stenosis RIGHT P2 segment. Mild cerebral artery atherosclerosis. Critical Value/emergent results were called by telephone at the time of interpretation on 09/14/2017 at 1:30 am to Dr. Paula Libra , who verbally acknowledged these results. Electronically Signed   By: Awilda Metro M.D.   On: 09/14/2017 01:36   Mr Brain Wo Contrast  Result Date: 09/14/2017 CLINICAL DATA:  Expressive transient aphasia and vision changes. EXAM: MRI HEAD WITHOUT CONTRAST TECHNIQUE:  Multiplanar, multiecho pulse sequences of the brain and surrounding structures were obtained without intravenous contrast. COMPARISON:  CT HEAD September 14, 2017 at 0041 hours FINDINGS: BRAIN: No reduced diffusion to suggest acute ischemia. No susceptibility artifact to suggest hemorrhage. The ventricles and sulci are normal for patient's age. Scattered supratentorial white matter subcentimeter FLAIR T2 hyperintensities without T1 hypointense signal to suggest demyelination. No suspicious parenchymal signal, mass or mass effect. No abnormal extra-axial fluid collections. VASCULAR: Normal major intracranial vascular flow voids present at skull base. SKULL AND UPPER CERVICAL SPINE: No abnormal sellar expansion. No suspicious calvarial bone marrow signal. Craniocervical junction maintained. SINUSES/ORBITS: LEFT maxillary mucosal retention cysts. Mastoid air cells are well aerated. The included ocular globes and orbital contents are non-suspicious. OTHER: None. IMPRESSION: 1. No acute intracranial process. 2. Mild chronic small vessel ischemic disease. Electronically Signed   By: Awilda Metro M.D.   On: 09/14/2017 04:57   Mm Screening Breast Tomo Bilateral  Result Date: 09/14/2017 CLINICAL DATA:  Screening. EXAM: 2D DIGITAL SCREENING BILATERAL MAMMOGRAM WITH CAD AND ADJUNCT TOMO COMPARISON:  Previous exam(s). ACR Breast Density Category c: The breast tissue is heterogeneously dense, which may obscure small masses. FINDINGS: There are no findings suspicious for malignancy. Images were processed with CAD. IMPRESSION: No mammographic evidence of malignancy. Jace Dowe result letter of this screening mammogram will be mailed directly to the patient. RECOMMENDATION: Screening mammogram in one year. (Code:[id number]) BI-RADS CATEGORY  1: Negative. Electronically Signed   By: Frederico Hamman M.D.   On: 09/14/2017 07:51        Scheduled Meds: .  stroke: mapping our early stages of recovery book   Does not apply Once  .  aspirin  300 mg Rectal Daily   Or  . aspirin  325  mg Oral Daily  . atorvastatin  20 mg Oral q1800  . enoxaparin (LOVENOX) injection  30 mg Subcutaneous Q24H   Continuous Infusions: . sodium chloride 50 mL/hr at 09/14/17 0549     LOS: 0 days    Time spent: over 30 min    Lacretia Nicks, MD [facility] Pager [phone]  If 7PM-7AM, please contact night-coverage [email or web address] Password [email or web address] 09/14/2017, 2:40 PM

## 2017-09-14 NOTE — ED Notes (Signed)
Patient eating breakfast. °

## 2017-09-14 NOTE — ED Notes (Signed)
[facility] charge nurse

## 2017-09-14 NOTE — ED Notes (Signed)
Patient off the floor for a scan.

## 2017-09-14 NOTE — Discharge Summary (Signed)
Physician Discharge Summary  Alleen BorneRuth M Kertz WUJ:[id number] DOB: 08/11/1956 DOA: 09/14/2017  PCP: Bosie Closice, Kathleen M, MD  Admit date: 09/14/2017 Discharge date: 09/14/2017  Time spent: over 30 minutes  Recommendations for Outpatient Follow-up:  1. Follow up repeat CBC and BMP 2. Follow up cardiac event monitor 3. Ensure neurology f/u in about 6 weeks 4. Started on daily ASA 81 mg.  LDL goal <100 per neurology, patient preferred to hold off on starting statin at this time.  Continue to discuss with PCP. 5. Follow up prediabetes with PCP 6. Continue to follow BP's as outpatient 7.  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 This lasted for around 15 minutes. Code stroke was called. CT head was unremarkable. This was followed by CT angiogram of the head and neck which also did not show any large vessel occlusion. [facility]/stroke workup.  Neurology evaluated and felt maybe c/w complex migraine/migraine variant.  Recommended f/u with 30 day cardiac event monitoring.  Follow up with PCP for cholesterol control and BP monitoring.    Hospital Course:  1. TIA - sudden onset word finding difficulty and problems reading now resolved.  Sx c/w complex migraine vs less likely TIA per neurology given lack of stroke risk factors.   1. MRI brain with chronic small vessel ischemic  disease 2. 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  Also mod stenosis R P2 segment and mild cerebral artery atherosclerosis 3. Echo without evidence for source of embili 4. Normal EEG 5. A1c 5.9, LDL 107 6. Neuro c/s, appreciate recs 1. ASA 81 mg at discharge 2. Atorvastatin 20 mg - after discussion with neurology, patient preferred to defer this at this time 3. Outpatient f/u neurology Darrol Angel(Carolyn Martin in 6 weeks) 4. Event monitor 30 days 2. Prediabetes: A1c 5.9, f/u with PCP 3. History of ulcerative colitis but off medications since patient's last colonoscopy was normal as per the patient and presently asymptomatic. 4. C3-4 and C4-5 neural foraminal narrowing per neurology. 5. Hypokalemia: replete, follow up mg   Procedures:  EEG, normal  Echo with EF 55-60%, normal wall motion.  No cardiac source of emboli identified.    Consultations:  neurology  Discharge Exam: Vitals:   09/14/17 1600 09/14/17 1700  BP: 122/77 135/73  Pulse: 61 (!) 56  Resp: 16 15  Temp:    SpO2: 99% 98%   Feels back to normal.  No weakness, slurred speech.    General: No acute distress. Cardiovascular: Heart sounds show Sotirios Navarro regular rate, and rhythm. No gallops or rubs. No murmurs. No JVD. Lungs: Clear to auscultation bilaterally with good air movement. No rales, rhonchi or wheezes. Abdomen: Soft, nontender, nondistended with normal active bowel sounds. No masses. No hepatosplenomegaly. Neurological: Alert and oriented 3. Moves all extremities 4 with equal strength.  Cranial nerves II through XII intact.  FNF and heel to shin normal.  Skin: Warm and dry. No rashes or lesions. Extremities: No clubbing or cyanosis. No edema. Pedal pulses 2+. Psychiatric: Mood and affect are normal. Insight and judgment are appropriate.   Discharge Instructions   Discharge Instructions     Ambulatory referral to Neurology    Complete by:  As directed    An appointment is requested in approximately: 6 weeks Follow up with stroke clinic Darrol Angel preferred, if not available, then consider Sylvie Farrier, [facility] or Lucia Gaskins whoever is available) at [facility] in about 6-8 weeks. Thanks.   Ambulatory referral to Neurology    Complete by:  As directed    An appointment is requested in approximately: 6 weeks Appointment requested with Nilda Riggs.   Call MD for:    Complete by:  As directed    New or concerning symptoms.  Stroke like symptoms.   Call MD for:  difficulty breathing, headache or visual disturbances    Complete by:  As directed    Call MD for:  persistant dizziness or light-headedness    Complete by:  As directed    Call MD for:  persistant nausea and vomiting    Complete by:  As directed    Call MD for:  redness, tenderness, or signs of infection (pain, swelling, redness, odor or green/yellow discharge around incision site)    Complete by:  As directed    Call MD for:  severe uncontrolled pain    Complete by:  As directed    Diet - low sodium heart healthy    Complete by:  As directed    Discharge instructions    Complete by:  As directed    You were seen in the emergency department after developing word finding difficulties and reading comprehension difficulty.  After discussing with neurology, it seems like this may have been Menucha Dicesare complex migraine.  TIA is Krishna Dancel possibility, but since you don't have many stroke risk factors, it is felt to be less likely.  We're starting you on Carisma Troupe baby aspirin daily.  Please follow up with your PCP regarding the statin (cholesterol lowering drug).  You should get Trenia Tennyson phone call from [facility] early next week to help set up the cardiac event monitor.  If you don't get Kilan Banfill call, please call [phone].  Please follow up with your PCP within the next week.  Please follow up with neurology in about 6 weeks.   Increase activity slowly     Complete by:  As directed      Current Discharge Medication List    START taking these medications   Details  aspirin EC 81 MG tablet Take 1 tablet (81 mg total) by mouth daily. Qty: 30 tablet, Refills: 0      CONTINUE these medications which have NOT CHANGED   Details  alendronate (FOSAMAX) 70 MG tablet Take 70 mg by mouth once Yanelle Sousa week. Take with Ikran Patman full glass of water on an empty stomach.    cholecalciferol (VITAMIN D) 1000 units tablet Take 1,000 Units by mouth once Bonni Neuser week.    Multiple Vitamin (MULTIVITAMIN WITH MINERALS) TABS tablet Take 1 tablet by mouth once Chais Fehringer week.    valACYclovir (VALTREX) 500 MG tablet Take 1,000 mg by mouth 2 (two) times daily as needed (fever blister).       Allergies  Allergen Reactions  . Sulfa Antibiotics Other (See Comments)    High fever, mood changes  and felt like death   Follow-up Information    Nilda Riggs, NP. Schedule an appointment as soon as possible for Fedra Lanter visit in 6 week(s).   Specialty:  Family Medicine Contact information: [address] [place] [address] [phone]            The results of significant diagnostics from this hospitalization (including imaging, microbiology, ancillary and laboratory) are listed below for reference.    Significant Diagnostic Studies: Ct Angio Head W Or Wo Contrast  Result Date: 09/14/2017 CLINICAL DATA:  Code stroke. Periodic confusion and loss of words, now better. EXAM: CT ANGIOGRAPHY HEAD AND NECK TECHNIQUE: Multidetector CT imaging of the head and neck was performed using the standard protocol during bolus administration of intravenous contrast. Multiplanar CT image reconstructions and MIPs were obtained to evaluate the vascular anatomy. Carotid stenosis measurements (when applicable) are obtained utilizing NASCET criteria, using the distal internal carotid diameter as the denominator. CONTRAST:  100 cc Isovue 370 COMPARISON:  None. FINDINGS: CT HEAD FINDINGS  BRAIN: No intraparenchymal hemorrhage, mass effect nor midline shift. The ventricles and sulci are normal for age. Minimal supratentorial white matter hypodensities within normal range for patient's age, though non-specific are most compatible with chronic small vessel ischemic disease. No acute large vascular territory infarcts. No abnormal extra-axial fluid collections. Basal cisterns are patent. VASCULAR: Mild-to-moderate calcific atherosclerosis of the carotid siphons. SKULL: No skull fracture. No significant scalp soft tissue swelling. SINUSES/ORBITS: Small LEFT maxillary mucosal retention cyst. Mastoid air cells are well aerated.The included ocular globes and orbital contents are non-suspicious. OTHER: None. CTA NECK FINDINGS AORTIC ARCH: Normal appearance of the thoracic arch, normal branch pattern. The origins of the innominate, left Common carotid artery and subclavian artery are widely patent. RIGHT CAROTID SYSTEM: Common carotid artery is widely patent, coursing in Alekai Pocock straight line fashion. Normal appearance of the carotid bifurcation without hemodynamically significant stenosis by NASCET criteria, minimal calcific atherosclerosis. Normal appearance of the internal carotid artery. LEFT CAROTID SYSTEM: Common carotid artery is widely patent, coursing in Kevan Prouty straight line fashion. Normal appearance of the carotid bifurcation without hemodynamically significant stenosis by NASCET criteria, minimal calcific atherosclerosis. Normal appearance of the internal carotid artery. VERTEBRAL ARTERIES:Codominant vertebral artery's. Normal appearance of the vertebral arteries, which appear widely patent. SKELETON: No acute osseous process though bone windows have not been submitted. Cervical spondylosis and facet arthropathy. Severe RIGHT C3-4, severe LEFT C4-5 neural foraminal narrowing. OTHER NECK: Soft tissues of the neck are nonacute though, not tailored for evaluation. UPPER CHEST: Included lung apices are clear. No  superior mediastinal lymphadenopathy. CTA HEAD FINDINGS ANTERIOR CIRCULATION: Patent cervical internal carotid arteries, petrous, cavernous and supra clinoid internal carotid arteries. Widely patent anterior communicating artery. Patent anterior and middle cerebral arteries, mild luminal irregularity. No large vessel occlusion, significant stenosis, contrast extravasation or aneurysm. POSTERIOR CIRCULATION: Patent vertebral arteries, vertebrobasilar junction and basilar artery, as well as main branch vessels. Patent posterior cerebral arteries. Fetal origin LEFT posterior cerebral artery. Mild luminal irregularity posterior cerebral artery's. Moderate stenosis RIGHT P2 segment. No large vessel occlusion, significant stenosis, contrast extravasation or aneurysm. VENOUS SINUSES: Major dural venous sinuses are patent though not tailored for evaluation on this angiographic examination. ANATOMIC VARIANTS: None. DELAYED PHASE: No abnormal intracranial enhancement. MIP images reviewed. IMPRESSION: CT HEAD: 1. Negative CT HEAD with and without contrast for age. CTA NECK: 1. Minimal atherosclerosis without hemodynamically significant stenosis or acute vascular process. 2. Severe C3-4 and C4-5 neural foraminal narrowing. CTA HEAD: 1. No  emergent large vessel occlusion or severe stenosis. 2. Moderate stenosis RIGHT P2 segment. Mild cerebral artery atherosclerosis. Critical Value/emergent results were called by telephone at the time of interpretation on 09/14/2017 at 1:30 am to Dr. Paula Libra , who verbally acknowledged these results. Electronically Signed   By: Awilda Metro M.D.   On: 09/14/2017 01:36   Ct Angio Neck W And/or Wo Contrast  Result Date: 09/14/2017 CLINICAL DATA:  Code stroke. Periodic confusion and loss of words, now better. EXAM: CT ANGIOGRAPHY HEAD AND NECK TECHNIQUE: Multidetector CT imaging of the head and neck was performed using the standard protocol during bolus administration of intravenous  contrast. Multiplanar CT image reconstructions and MIPs were obtained to evaluate the vascular anatomy. Carotid stenosis measurements (when applicable) are obtained utilizing NASCET criteria, using the distal internal carotid diameter as the denominator. CONTRAST:  100 cc Isovue 370 COMPARISON:  None. FINDINGS: CT HEAD FINDINGS BRAIN: No intraparenchymal hemorrhage, mass effect nor midline shift. The ventricles and sulci are normal for age. Minimal supratentorial white matter hypodensities within normal range for patient's age, though non-specific are most compatible with chronic small vessel ischemic disease. No acute large vascular territory infarcts. No abnormal extra-axial fluid collections. Basal cisterns are patent. VASCULAR: Mild-to-moderate calcific atherosclerosis of the carotid siphons. SKULL: No skull fracture. No significant scalp soft tissue swelling. SINUSES/ORBITS: Small LEFT maxillary mucosal retention cyst. Mastoid air cells are well aerated.The included ocular globes and orbital contents are non-suspicious. OTHER: None. CTA NECK FINDINGS AORTIC ARCH: Normal appearance of the thoracic arch, normal branch pattern. The origins of the innominate, left Common carotid artery and subclavian artery are widely patent. RIGHT CAROTID SYSTEM: Common carotid artery is widely patent, coursing in Keanen Dohse straight line fashion. Normal appearance of the carotid bifurcation without hemodynamically significant stenosis by NASCET criteria, minimal calcific atherosclerosis. Normal appearance of the internal carotid artery. LEFT CAROTID SYSTEM: Common carotid artery is widely patent, coursing in Shamika Pedregon straight line fashion. Normal appearance of the carotid bifurcation without hemodynamically significant stenosis by NASCET criteria, minimal calcific atherosclerosis. Normal appearance of the internal carotid artery. VERTEBRAL ARTERIES:Codominant vertebral artery's. Normal appearance of the vertebral arteries, which appear widely  patent. SKELETON: No acute osseous process though bone windows have not been submitted. Cervical spondylosis and facet arthropathy. Severe RIGHT C3-4, severe LEFT C4-5 neural foraminal narrowing. OTHER NECK: Soft tissues of the neck are nonacute though, not tailored for evaluation. UPPER CHEST: Included lung apices are clear. No superior mediastinal lymphadenopathy. CTA HEAD FINDINGS ANTERIOR CIRCULATION: Patent cervical internal carotid arteries, petrous, cavernous and supra clinoid internal carotid arteries. Widely patent anterior communicating artery. Patent anterior and middle cerebral arteries, mild luminal irregularity. No large vessel occlusion, significant stenosis, contrast extravasation or aneurysm. POSTERIOR CIRCULATION: Patent vertebral arteries, vertebrobasilar junction and basilar artery, as well as main branch vessels. Patent posterior cerebral arteries. Fetal origin LEFT posterior cerebral artery. Mild luminal irregularity posterior cerebral artery's. Moderate stenosis RIGHT P2 segment. No large vessel occlusion, significant stenosis, contrast extravasation or aneurysm. VENOUS SINUSES: Major dural venous sinuses are patent though not tailored for evaluation on this angiographic examination. ANATOMIC VARIANTS: None. DELAYED PHASE: No abnormal intracranial enhancement. MIP images reviewed. IMPRESSION: CT HEAD: 1. Negative CT HEAD with and without contrast for age. CTA NECK: 1. Minimal atherosclerosis without hemodynamically significant stenosis or acute vascular process. 2. Severe C3-4 and C4-5 neural foraminal narrowing. CTA HEAD: 1. No emergent large vessel occlusion or severe stenosis. 2. Moderate stenosis RIGHT P2 segment. Mild cerebral artery atherosclerosis. Critical Value/emergent results were  called by telephone at the time of interpretation on 09/14/2017 at 1:30 am to Dr. Paula Libra , who verbally acknowledged these results. Electronically Signed   By: Awilda Metro M.D.   On: 09/14/2017  01:36   Mr Brain Wo Contrast  Result Date: 09/14/2017 CLINICAL DATA:  Expressive transient aphasia and vision changes. EXAM: MRI HEAD WITHOUT CONTRAST TECHNIQUE: Multiplanar, multiecho pulse sequences of the brain and surrounding structures were obtained without intravenous contrast. COMPARISON:  CT HEAD September 14, 2017 at 0041 hours FINDINGS: BRAIN: No reduced diffusion to suggest acute ischemia. No susceptibility artifact to suggest hemorrhage. The ventricles and sulci are normal for patient's age. Scattered supratentorial white matter subcentimeter FLAIR T2 hyperintensities without T1 hypointense signal to suggest demyelination. No suspicious parenchymal signal, mass or mass effect. No abnormal extra-axial fluid collections. VASCULAR: Normal major intracranial vascular flow voids present at skull base. SKULL AND UPPER CERVICAL SPINE: No abnormal sellar expansion. No suspicious calvarial bone marrow signal. Craniocervical junction maintained. SINUSES/ORBITS: LEFT maxillary mucosal retention cysts. Mastoid air cells are well aerated. The included ocular globes and orbital contents are non-suspicious. OTHER: None. IMPRESSION: 1. No acute intracranial process. 2. Mild chronic small vessel ischemic disease. Electronically Signed   By: Awilda Metro M.D.   On: 09/14/2017 04:57   Mm Screening Breast Tomo Bilateral  Result Date: 09/14/2017 CLINICAL DATA:  Screening. EXAM: 2D DIGITAL SCREENING BILATERAL MAMMOGRAM WITH CAD AND ADJUNCT TOMO COMPARISON:  Previous exam(s). ACR Breast Density Category c: The breast tissue is heterogeneously dense, which may obscure small masses. FINDINGS: There are no findings suspicious for malignancy. Images were processed with CAD. IMPRESSION: No mammographic evidence of malignancy. Marquiz Sotelo result letter of this screening mammogram will be mailed directly to the patient. RECOMMENDATION: Screening mammogram in one year. (Code:SM-B-01Y) BI-RADS CATEGORY  1: Negative. Electronically  Signed   By: Frederico Hamman M.D.   On: 09/14/2017 07:51    Microbiology: No results found for this or any previous visit (from the past 240 hour(s)).   Labs: Basic Metabolic Panel:  Recent Labs Lab 09/14/17 0018 09/14/17 0508  NA 137  --   K 3.3*  --   CL 101  --   CO2 28  --   GLUCOSE 103*  --   BUN 15  --   CREATININE 0.61 0.60  CALCIUM 9.6  --    Liver Function Tests:  Recent Labs Lab 09/14/17 0018  AST 29  ALT 22  ALKPHOS 45  BILITOT 0.7  PROT 7.3  ALBUMIN 5.0   No results for input(s): LIPASE, AMYLASE in the last 168 hours. No results for input(s): AMMONIA in the last 168 hours. CBC:  Recent Labs Lab 09/14/17 0018 09/14/17 0508  WBC 7.6 5.7  NEUTROABS 3.2  --   HGB 13.6 13.4  HCT 41.0 39.8  MCV 92.3 90.7  PLT 220 198   Cardiac Enzymes:  Recent Labs Lab 09/14/17 0018  TROPONINI <0.03   BNP: BNP (last 3 results) No results for input(s): BNP in the last 8760 hours.  ProBNP (last 3 results) No results for input(s): PROBNP in the last 8760 hours.  CBG: No results for input(s): GLUCAP in the last 168 hours.     Signed:  Lacretia Nicks MD.  [facility] 09/14/2017, 6:12 PM

## 2017-09-14 NOTE — ED Provider Notes (Addendum)
[facility] Provider Note: Lowella Dell, MD, FACEP  CSN: [id number] MRN: [id number] ARRIVAL: 09/14/17 at 0021 ROOM: A03C/A03C   CHIEF COMPLAINT  Other (stroke like symptoms)   HISTORY OF PRESENT ILLNESS  09/14/17 12:20 AM (time seen) Samantha Matthews is a 61 y.o. female without significant past medical history.  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  She has had about 15 minutes of difficulty both reading words and speaking words.  She is not completely aphasic and can read some words and still carry on a conversation.  She has had difficulty naming certain objects and concepts.  She denies associated headache, blurred vision or scotomata.  She is having no focal motor or sensory deficits.  She was unable to name certain objects to me but this improved over the next several minutes prior to formal neurologic examination.  Code stroke called, Dr. Laurence Slate assaulted who accepts the patient for transfer to [facility] ED.  He requests CT angiogram of the head and neck prior to transport.   Past Medical History:  Diagnosis Date  . Osteoporosis   . Ulcerative colitis (HCC)     History reviewed. No pertinent surgical history.  History reviewed. No pertinent family history.  Social History  Substance Use Topics  . Smoking status: Former Games developer  . Smokeless tobacco: Never Used  . Alcohol use No    Prior to Admission medications   Not on File    Allergies Sulfa antibiotics   REVIEW OF SYSTEMS  Negative except as noted here or in the History of Present Illness.   PHYSICAL EXAMINATION  Initial Vital Signs Blood pressure (!) 147/80, pulse 87, temperature 97.8 F (36.6 C), resp. rate 12, height 5' 2.5" (1.588 m), weight 47.2 kg (104 lb), SpO2 100 %.  Examination General: Well-developed, well-nourished female in no acute distress; appearance consistent with age of record HENT: normocephalic; atraumatic Eyes: pupils equal, round and reactive to light; extraocular muscles  intact; visual fields intact Neck: supple; no bruit Heart: regular rate and rhythm; occasional PACs Lungs: clear to auscultation bilaterally Abdomen: soft; nondistended; nontender; no masses or hepatosplenomegaly; bowel sounds present Extremities: No deformity; full range of motion; pulses normal Neurologic: Awake, alert and oriented; motor function intact in all extremities and symmetric; sensation intact in all extremities and symmetric; no facial droop; tongue midline; normal coordination and speech; stroke score 0 Skin: Warm and dry Psychiatric: Anxious   RESULTS  Summary of this visit's results, reviewed by myself:   EKG Interpretation  Date/Time:  Friday September 14 2017 00:50:22 EDT Ventricular Rate:  79 PR Interval:    QRS Duration: 98 QT Interval:  419 QTC Calculation: 481 R Axis:   38 Text Interpretation:  Sinus rhythm Premature atrial complexes Probable left atrial enlargement No previous ECGs available Confirmed by Benuel Ly, Jonny Ruiz ([id number]) on 09/14/2017 12:57:18 AM      Laboratory Studies: Results for orders placed or performed during the hospital encounter of 09/14/17 (from the past 24 hour(s))  Ethanol     Status: None   Collection Time: 09/14/17 12:18 AM  Result Value Ref Range   Alcohol, Ethyl (B) <10 <10 mg/dL  Protime-INR     Status: None   Collection Time: 09/14/17 12:18 AM  Result Value Ref Range   Prothrombin Time 12.4 11.4 - 15.2 seconds   INR 0.93   APTT     Status: None   Collection Time: 09/14/17 12:18 AM  Result Value Ref Range   aPTT 33 24 -  36 seconds  CBC     Status: None   Collection Time: 09/14/17 12:18 AM  Result Value Ref Range   WBC 7.6 4.0 - 10.5 K/uL   RBC 4.44 3.87 - 5.11 MIL/uL   Hemoglobin 13.6 12.0 - 15.0 g/dL   HCT 16.1 09.6 - 04.5 %   MCV 92.3 78.0 - 100.0 fL   MCH 30.6 26.0 - 34.0 pg   MCHC 33.2 30.0 - 36.0 g/dL   RDW 40.9 81.1 - 91.4 %   Platelets 220 150 - 400 K/uL  Differential     Status: None   Collection Time: 09/14/17  12:18 AM  Result Value Ref Range   Neutrophils Relative % 42 %   Neutro Abs 3.2 1.7 - 7.7 K/uL   Lymphocytes Relative 51 %   Lymphs Abs 3.9 0.7 - 4.0 K/uL   Monocytes Relative 6 %   Monocytes Absolute 0.5 0.1 - 1.0 K/uL   Eosinophils Relative 1 %   Eosinophils Absolute 0.1 0.0 - 0.7 K/uL   Basophils Relative 0 %   Basophils Absolute 0.0 0.0 - 0.1 K/uL  Comprehensive metabolic panel     Status: Abnormal   Collection Time: 09/14/17 12:18 AM  Result Value Ref Range   Sodium 137 135 - 145 mmol/L   Potassium 3.3 (L) 3.5 - 5.1 mmol/L   Chloride 101 101 - 111 mmol/L   CO2 28 22 - 32 mmol/L   Glucose, Bld 103 (H) 65 - 99 mg/dL   BUN 15 6 - 20 mg/dL   Creatinine, Ser 7.82 0.44 - 1.00 mg/dL   Calcium 9.6 8.9 - 95.6 mg/dL   Total Protein 7.3 6.5 - 8.1 g/dL   Albumin 5.0 3.5 - 5.0 g/dL   AST 29 15 - 41 U/L   ALT 22 14 - 54 U/L   Alkaline Phosphatase 45 38 - 126 U/L   Total Bilirubin 0.7 0.3 - 1.2 mg/dL   GFR calc non Af Amer >60 >60 mL/min   GFR calc Af Amer >60 >60 mL/min   Anion gap 8 5 - 15  Troponin I     Status: None   Collection Time: 09/14/17 12:18 AM  Result Value Ref Range   Troponin I <0.03 <0.03 ng/mL  Urine rapid drug screen (hosp performed)     Status: None   Collection Time: 09/14/17 12:40 AM  Result Value Ref Range   Opiates NONE DETECTED NONE DETECTED   Cocaine NONE DETECTED NONE DETECTED   Benzodiazepines NONE DETECTED NONE DETECTED   Amphetamines NONE DETECTED NONE DETECTED   Tetrahydrocannabinol NONE DETECTED NONE DETECTED   Barbiturates NONE DETECTED NONE DETECTED  Urinalysis, Routine w reflex microscopic     Status: Abnormal   Collection Time: 09/14/17 12:40 AM  Result Value Ref Range   Color, Urine STRAW (A) YELLOW   APPearance CLEAR CLEAR   Specific Gravity, Urine <1.005 (L) 1.005 - 1.030   pH 7.0 5.0 - 8.0   Glucose, UA NEGATIVE NEGATIVE mg/dL   Hgb urine dipstick NEGATIVE NEGATIVE   Bilirubin Urine NEGATIVE NEGATIVE   Ketones, ur NEGATIVE NEGATIVE  mg/dL   Protein, ur NEGATIVE NEGATIVE mg/dL   Nitrite NEGATIVE NEGATIVE   Leukocytes, UA TRACE (A) NEGATIVE  Urinalysis, Microscopic (reflex)     Status: Abnormal   Collection Time: 09/14/17 12:40 AM  Result Value Ref Range   RBC / HPF NONE SEEN 0 - 5 RBC/hpf   WBC, UA 0-5 0 - 5 WBC/hpf   Bacteria, UA  RARE (A) NONE SEEN   Squamous Epithelial / LPF 0-5 (A) NONE SEEN   Imaging Studies: Ct Angio Head W Or Wo Contrast  Result Date: 09/14/2017 CLINICAL DATA:  Code stroke. Periodic confusion and loss of words, now better. EXAM: CT ANGIOGRAPHY HEAD AND NECK TECHNIQUE: Multidetector CT imaging of the head and neck was performed using the standard protocol during bolus administration of intravenous contrast. Multiplanar CT image reconstructions and MIPs were obtained to evaluate the vascular anatomy. Carotid stenosis measurements (when applicable) are obtained utilizing NASCET criteria, using the distal internal carotid diameter as the denominator. CONTRAST:  100 cc Isovue 370 COMPARISON:  None. FINDINGS: CT HEAD FINDINGS BRAIN: No intraparenchymal hemorrhage, mass effect nor midline shift. The ventricles and sulci are normal for age. Minimal supratentorial white matter hypodensities within normal range for patient's age, though non-specific are most compatible with chronic small vessel ischemic disease. No acute large vascular territory infarcts. No abnormal extra-axial fluid collections. Basal cisterns are patent. VASCULAR: Mild-to-moderate calcific atherosclerosis of the carotid siphons. SKULL: No skull fracture. No significant scalp soft tissue swelling. SINUSES/ORBITS: Small LEFT maxillary mucosal retention cyst. Mastoid air cells are well aerated.The included ocular globes and orbital contents are non-suspicious. OTHER: None. CTA NECK FINDINGS AORTIC ARCH: Normal appearance of the thoracic arch, normal branch pattern. The origins of the innominate, left Common carotid artery and subclavian artery are  widely patent. RIGHT CAROTID SYSTEM: Common carotid artery is widely patent, coursing in a straight line fashion. Normal appearance of the carotid bifurcation without hemodynamically significant stenosis by NASCET criteria, minimal calcific atherosclerosis. Normal appearance of the internal carotid artery. LEFT CAROTID SYSTEM: Common carotid artery is widely patent, coursing in a straight line fashion. Normal appearance of the carotid bifurcation without hemodynamically significant stenosis by NASCET criteria, minimal calcific atherosclerosis. Normal appearance of the internal carotid artery. VERTEBRAL ARTERIES:Codominant vertebral artery's. Normal appearance of the vertebral arteries, which appear widely patent. SKELETON: No acute osseous process though bone windows have not been submitted. Cervical spondylosis and facet arthropathy. Severe RIGHT C3-4, severe LEFT C4-5 neural foraminal narrowing. OTHER NECK: Soft tissues of the neck are nonacute though, not tailored for evaluation. UPPER CHEST: Included lung apices are clear. No superior mediastinal lymphadenopathy. CTA HEAD FINDINGS ANTERIOR CIRCULATION: Patent cervical internal carotid arteries, petrous, cavernous and supra clinoid internal carotid arteries. Widely patent anterior communicating artery. Patent anterior and middle cerebral arteries, mild luminal irregularity. No large vessel occlusion, significant stenosis, contrast extravasation or aneurysm. POSTERIOR CIRCULATION: Patent vertebral arteries, vertebrobasilar junction and basilar artery, as well as main branch vessels. Patent posterior cerebral arteries. Fetal origin LEFT posterior cerebral artery. Mild luminal irregularity posterior cerebral artery's. Moderate stenosis RIGHT P2 segment. No large vessel occlusion, significant stenosis, contrast extravasation or aneurysm. VENOUS SINUSES: Major dural venous sinuses are patent though not tailored for evaluation on this angiographic examination. ANATOMIC  VARIANTS: None. DELAYED PHASE: No abnormal intracranial enhancement. MIP images reviewed. IMPRESSION: CT HEAD: 1. Negative CT HEAD with and without contrast for age. CTA NECK: 1. Minimal atherosclerosis without hemodynamically significant stenosis or acute vascular process. 2. Severe C3-4 and C4-5 neural foraminal narrowing. CTA HEAD: 1. No emergent large vessel occlusion or severe stenosis. 2. Moderate stenosis RIGHT P2 segment. Mild cerebral artery atherosclerosis. Critical Value/emergent results were called by telephone at the time of interpretation on 09/14/2017 at 1:30 am to Dr. Paula Libra , who verbally acknowledged these results. Electronically Signed   By: Awilda Metro M.D.   On: 09/14/2017 01:36   Ct Angio Neck  W And/or Wo Contrast  Result Date: 09/14/2017 CLINICAL DATA:  Code stroke. Periodic confusion and loss of words, now better. EXAM: CT ANGIOGRAPHY HEAD AND NECK TECHNIQUE: Multidetector CT imaging of the head and neck was performed using the standard protocol during bolus administration of intravenous contrast. Multiplanar CT image reconstructions and MIPs were obtained to evaluate the vascular anatomy. Carotid stenosis measurements (when applicable) are obtained utilizing NASCET criteria, using the distal internal carotid diameter as the denominator. CONTRAST:  100 cc Isovue 370 COMPARISON:  None. FINDINGS: CT HEAD FINDINGS BRAIN: No intraparenchymal hemorrhage, mass effect nor midline shift. The ventricles and sulci are normal for age. Minimal supratentorial white matter hypodensities within normal range for patient's age, though non-specific are most compatible with chronic small vessel ischemic disease. No acute large vascular territory infarcts. No abnormal extra-axial fluid collections. Basal cisterns are patent. VASCULAR: Mild-to-moderate calcific atherosclerosis of the carotid siphons. SKULL: No skull fracture. No significant scalp soft tissue swelling. SINUSES/ORBITS: Small LEFT  maxillary mucosal retention cyst. Mastoid air cells are well aerated.The included ocular globes and orbital contents are non-suspicious. OTHER: None. CTA NECK FINDINGS AORTIC ARCH: Normal appearance of the thoracic arch, normal branch pattern. The origins of the innominate, left Common carotid artery and subclavian artery are widely patent. RIGHT CAROTID SYSTEM: Common carotid artery is widely patent, coursing in a straight line fashion. Normal appearance of the carotid bifurcation without hemodynamically significant stenosis by NASCET criteria, minimal calcific atherosclerosis. Normal appearance of the internal carotid artery. LEFT CAROTID SYSTEM: Common carotid artery is widely patent, coursing in a straight line fashion. Normal appearance of the carotid bifurcation without hemodynamically significant stenosis by NASCET criteria, minimal calcific atherosclerosis. Normal appearance of the internal carotid artery. VERTEBRAL ARTERIES:Codominant vertebral artery's. Normal appearance of the vertebral arteries, which appear widely patent. SKELETON: No acute osseous process though bone windows have not been submitted. Cervical spondylosis and facet arthropathy. Severe RIGHT C3-4, severe LEFT C4-5 neural foraminal narrowing. OTHER NECK: Soft tissues of the neck are nonacute though, not tailored for evaluation. UPPER CHEST: Included lung apices are clear. No superior mediastinal lymphadenopathy. CTA HEAD FINDINGS ANTERIOR CIRCULATION: Patent cervical internal carotid arteries, petrous, cavernous and supra clinoid internal carotid arteries. Widely patent anterior communicating artery. Patent anterior and middle cerebral arteries, mild luminal irregularity. No large vessel occlusion, significant stenosis, contrast extravasation or aneurysm. POSTERIOR CIRCULATION: Patent vertebral arteries, vertebrobasilar junction and basilar artery, as well as main branch vessels. Patent posterior cerebral arteries. Fetal origin LEFT  posterior cerebral artery. Mild luminal irregularity posterior cerebral artery's. Moderate stenosis RIGHT P2 segment. No large vessel occlusion, significant stenosis, contrast extravasation or aneurysm. VENOUS SINUSES: Major dural venous sinuses are patent though not tailored for evaluation on this angiographic examination. ANATOMIC VARIANTS: None. DELAYED PHASE: No abnormal intracranial enhancement. MIP images reviewed. IMPRESSION: CT HEAD: 1. Negative CT HEAD with and without contrast for age. CTA NECK: 1. Minimal atherosclerosis without hemodynamically significant stenosis or acute vascular process. 2. Severe C3-4 and C4-5 neural foraminal narrowing. CTA HEAD: 1. No emergent large vessel occlusion or severe stenosis. 2. Moderate stenosis RIGHT P2 segment. Mild cerebral artery atherosclerosis. Critical Value/emergent results were called by telephone at the time of interpretation on 09/14/2017 at 1:30 am to Dr. Paula LibraJOHN Breniyah Romm , who verbally acknowledged these results. Electronically Signed   By: Awilda Metroourtnay  Bloomer M.D.   On: 09/14/2017 01:36    ED COURSE  Nursing notes and initial vitals signs, including pulse oximetry, reviewed.  Vitals:   09/14/17 0024 09/14/17 0055 09/14/17 0130  09/14/17 0215  BP: (!) 169/88 (!) 147/80 (!) 144/84 125/81  Pulse: 77 87 77 70  Resp: 16 12    Temp: 97.8 F (36.6 C)     SpO2: 98% 100% 99% 97%  Weight: 47.2 kg (104 lb)     Height: 5' 2.5" (1.588 m)      12:59 AM Stroke score 0.  [facility] at bedside.  PROCEDURES   CRITICAL CARE Performed by: Paula Libra L Total critical care time: 45 minutes Critical care time was exclusive of separately billable procedures and treating other patients. Critical care was necessary to treat or prevent imminent or life-threatening deterioration. Critical care was time spent personally by me on the following activities: development of treatment plan with patient and/or surrogate as well as nursing, discussions with consultants,  evaluation of patient's response to treatment, examination of patient, obtaining history from patient or surrogate, ordering and performing treatments and interventions, ordering and review of laboratory studies, ordering and review of radiographic studies, pulse oximetry and re-evaluation of patient's condition.   ED DIAGNOSES     ICD-10-CM   1. Aphasia R47.01        Paula Libra, MD 09/14/17 1610    Paula Libra, MD 09/14/17 [phone]

## 2017-09-14 NOTE — ED Notes (Signed)
Patient transported to CT 

## 2017-09-14 NOTE — ED Notes (Signed)
Placed on hospital bed

## 2017-09-14 NOTE — H&P (Signed)
History and Physical    Samantha Matthews ZOX:[id number] DOB: 02/28/1956 DOA: 09/14/2017  PCP: Bosie Closice, Kathleen M, MD  Patient coming from: [facility].  Chief Complaint: Difficulty naming things.  HPI: Samantha Matthews is a 61 y.o. 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  This lasted for around 15 minutes.  Code stroke was called.  CT head was unremarkable.  This was followed by CT angiogram of the head and neck which also did not show any large vessel occlusion.  [facility] further TIA/stroke workup.  ED Course: In the ER patient appears nonfocal.  At the time of my exam patient is ambulating without any difficulty.  Patient did not have any weakness of the extremities or difficulty swallowing.  Denies any visual symptoms.  Neurology on-call has been consulted and patient admitted for TIA workup.  Review of Systems: As per HPI, rest all negative.   Past Medical History:  Diagnosis Date  . Osteoporosis   . Ulcerative colitis [facility](HCC)     Past Surgical History:  Procedure Laterality Date  . TUBAL LIGATION       reports that she has quit smoking. She has never used smokeless tobacco. She reports that she does not drink alcohol or use drugs.  Allergies  Allergen Reactions  . Sulfa Antibiotics Other (See Comments)    High fever, mood changes and felt like death    Family History  Problem Relation Age of Onset  . Lupus Mother   . Diabetes Mellitus II Father   . Stroke Father   . Hypertension Father     Prior to Admission medications   Medication Sig Start Date End Date Taking? Authorizing Provider  alendronate (FOSAMAX) 70 MG tablet Take 70 mg by mouth once a week. Take with a full glass of water on an empty stomach.   Yes [provider]  cholecalciferol (VITAMIN D) 1000 units tablet Take 1,000 Units by mouth once a week.   Yes [provider]  Multiple Vitamin (MULTIVITAMIN WITH MINERALS) TABS tablet Take 1 tablet by mouth once a week.   Yes [provider]  valACYclovir (VALTREX) 500 MG tablet Take 1,000 mg by mouth 2 (two) times daily as needed (fever blister).   Yes [provider]    Physical Exam: Vitals:   09/14/17 0055 09/14/17 0130 09/14/17 0215 09/14/17 0300  BP: (!) 147/80 (!) 144/84 125/81 130/82  Pulse: 87 77 70 73  Resp: 12     Temp:      SpO2: 100% 99% 97% 97%  Weight:      Height:          Constitutional: Moderately built and nourished. Vitals:   09/14/17 0055 09/14/17 0130 09/14/17 0215 09/14/17 0300  BP: (!) 147/80 (!) 144/84 125/81 130/82  Pulse: 87 77 70 73  Resp: 12     Temp:      SpO2: 100% 99% 97% 97%  Weight:      Height:       Eyes: Anicteric no pallor. ENMT: No discharge from the ears eyes nose or mouth. Neck: No mass felt.  No JVD appreciated no carotid bruit appreciated. Respiratory: No rhonchi or crepitations. Cardiovascular: S1-S2 heard no murmurs appreciated. Abdomen: Soft nontender bowel sounds present. Musculoskeletal:  No edema.  No joint effusion. Skin: No rash.  Skin appears warm. Neurologic: Alert awake oriented to time place and person.  Moves all extremities 5 x 5.  No facial asymmetry.  Tongue is midline.  Pupils are equal and reacting to light. Psychiatric: Appears normal.   Labs on Admission: I have personally reviewed following labs and imaging studies  CBC:  Recent Labs Lab 09/14/17 0018  WBC 7.6  NEUTROABS 3.2  HGB 13.6  HCT 41.0  MCV 92.3  PLT 220   Basic Metabolic Panel:  Recent Labs Lab 09/14/17 0018  NA 137  K 3.3*  CL 101  CO2 28  GLUCOSE 103*  BUN 15  CREATININE 0.61  CALCIUM 9.6   GFR: Estimated Creatinine Clearance: 55 mL/min (by C-G formula based on SCr of 0.61 mg/dL). Liver Function  Tests:  Recent Labs Lab 09/14/17 0018  AST 29  ALT 22  ALKPHOS 45  BILITOT 0.7  PROT 7.3  ALBUMIN 5.0   No results for input(s): LIPASE, AMYLASE in the last 168 hours. No results for input(s): AMMONIA in the last 168 hours. Coagulation Profile:  Recent Labs Lab 09/14/17 0018  INR 0.93   Cardiac Enzymes:  Recent Labs Lab 09/14/17 0018  TROPONINI <0.03   BNP (last 3 results) No results for input(s): PROBNP in the last 8760 hours. HbA1C: No results for input(s): HGBA1C in the last 72 hours. CBG: No results for input(s): GLUCAP in the last 168 hours. Lipid Profile: No results for input(s): CHOL, HDL, LDLCALC, TRIG, CHOLHDL, LDLDIRECT in the last 72 hours. Thyroid Function Tests: No results for input(s): TSH, T4TOTAL, FREET4, T3FREE, THYROIDAB in the last 72 hours. Anemia Panel: No results for input(s): VITAMINB12, FOLATE, FERRITIN, TIBC, IRON, RETICCTPCT in the last 72 hours. Urine analysis:    Component Value Date/Time   COLORURINE STRAW (A) 09/14/2017 0040   APPEARANCEUR CLEAR 09/14/2017 0040   LABSPEC <1.005 (L) 09/14/2017 0040   PHURINE 7.0 09/14/2017 0040   GLUCOSEU NEGATIVE 09/14/2017 0040   HGBUR NEGATIVE 09/14/2017 0040   BILIRUBINUR NEGATIVE 09/14/2017 0040   KETONESUR NEGATIVE 09/14/2017 0040   PROTEINUR NEGATIVE 09/14/2017 0040   NITRITE NEGATIVE 09/14/2017 0040   LEUKOCYTESUR TRACE (A) 09/14/2017 0040   Sepsis Labs: @LABRCNTIP (procalcitonin:4,lacticidven:4) )No results found for this or any previous visit (from the past 240 hour(s)).   Radiological Exams on Admission: Ct Angio Head W Or Wo Contrast  Result Date: 09/14/2017 CLINICAL DATA:  Code stroke. Periodic confusion and loss of words, now better. EXAM: CT ANGIOGRAPHY HEAD AND NECK TECHNIQUE: Multidetector CT imaging of the head and neck was performed using the standard protocol during bolus administration of intravenous contrast. Multiplanar CT image reconstructions and MIPs were obtained to  evaluate the vascular anatomy. Carotid stenosis measurements (when applicable) are obtained utilizing NASCET criteria, using the distal internal carotid diameter as the denominator. CONTRAST:  100 cc Isovue 370 COMPARISON:  None. FINDINGS: CT HEAD FINDINGS BRAIN: No intraparenchymal hemorrhage, mass effect nor midline shift. The ventricles and sulci are normal for age. Minimal supratentorial white matter hypodensities within normal range for patient's age, though non-specific are most compatible with chronic small vessel ischemic disease. No acute large vascular territory infarcts. No abnormal extra-axial fluid collections. Basal cisterns are patent. VASCULAR: Mild-to-moderate calcific atherosclerosis of the carotid siphons. SKULL: No skull fracture. No significant scalp soft tissue swelling. SINUSES/ORBITS: Small LEFT maxillary mucosal retention cyst. Mastoid air cells are well aerated.The included ocular globes and orbital contents are non-suspicious. OTHER: None. CTA NECK  FINDINGS AORTIC ARCH: Normal appearance of the thoracic arch, normal branch pattern. The origins of the innominate, left Common carotid artery and subclavian artery are widely patent. RIGHT CAROTID SYSTEM: Common carotid artery is widely patent, coursing in a straight line fashion. Normal appearance of the carotid bifurcation without hemodynamically significant stenosis by NASCET criteria, minimal calcific atherosclerosis. Normal appearance of the internal carotid artery. LEFT CAROTID SYSTEM: Common carotid artery is widely patent, coursing in a straight line fashion. Normal appearance of the carotid bifurcation without hemodynamically significant stenosis by NASCET criteria, minimal calcific atherosclerosis. Normal appearance of the internal carotid artery. VERTEBRAL ARTERIES:Codominant vertebral artery's. Normal appearance of the vertebral arteries, which appear widely patent. SKELETON: No acute osseous process though bone windows have not  been submitted. Cervical spondylosis and facet arthropathy. Severe RIGHT C3-4, severe LEFT C4-5 neural foraminal narrowing. OTHER NECK: Soft tissues of the neck are nonacute though, not tailored for evaluation. UPPER CHEST: Included lung apices are clear. No superior mediastinal lymphadenopathy. CTA HEAD FINDINGS ANTERIOR CIRCULATION: Patent cervical internal carotid arteries, petrous, cavernous and supra clinoid internal carotid arteries. Widely patent anterior communicating artery. Patent anterior and middle cerebral arteries, mild luminal irregularity. No large vessel occlusion, significant stenosis, contrast extravasation or aneurysm. POSTERIOR CIRCULATION: Patent vertebral arteries, vertebrobasilar junction and basilar artery, as well as main branch vessels. Patent posterior cerebral arteries. Fetal origin LEFT posterior cerebral artery. Mild luminal irregularity posterior cerebral artery's. Moderate stenosis RIGHT P2 segment. No large vessel occlusion, significant stenosis, contrast extravasation or aneurysm. VENOUS SINUSES: Major dural venous sinuses are patent though not tailored for evaluation on this angiographic examination. ANATOMIC VARIANTS: None. DELAYED PHASE: No abnormal intracranial enhancement. MIP images reviewed. IMPRESSION: CT HEAD: 1. Negative CT HEAD with and without contrast for age. CTA NECK: 1. Minimal atherosclerosis without hemodynamically significant stenosis or acute vascular process. 2. Severe C3-4 and C4-5 neural foraminal narrowing. CTA HEAD: 1. No emergent large vessel occlusion or severe stenosis. 2. Moderate stenosis RIGHT P2 segment. Mild cerebral artery atherosclerosis. Critical Value/emergent results were called by telephone at the time of interpretation on 09/14/2017 at 1:30 am to Dr. Paula Libra , who verbally acknowledged these results. Electronically Signed   By: Awilda Metro M.D.   On: 09/14/2017 01:36   Ct Angio Neck W And/or Wo Contrast  Result Date:  09/14/2017 CLINICAL DATA:  Code stroke. Periodic confusion and loss of words, now better. EXAM: CT ANGIOGRAPHY HEAD AND NECK TECHNIQUE: Multidetector CT imaging of the head and neck was performed using the standard protocol during bolus administration of intravenous contrast. Multiplanar CT image reconstructions and MIPs were obtained to evaluate the vascular anatomy. Carotid stenosis measurements (when applicable) are obtained utilizing NASCET criteria, using the distal internal carotid diameter as the denominator. CONTRAST:  100 cc Isovue 370 COMPARISON:  None. FINDINGS: CT HEAD FINDINGS BRAIN: No intraparenchymal hemorrhage, mass effect nor midline shift. The ventricles and sulci are normal for age. Minimal supratentorial white matter hypodensities within normal range for patient's age, though non-specific are most compatible with chronic small vessel ischemic disease. No acute large vascular territory infarcts. No abnormal extra-axial fluid collections. Basal cisterns are patent. VASCULAR: Mild-to-moderate calcific atherosclerosis of the carotid siphons. SKULL: No skull fracture. No significant scalp soft tissue swelling. SINUSES/ORBITS: Small LEFT maxillary mucosal retention cyst. Mastoid air cells are well aerated.The included ocular globes and orbital contents are non-suspicious. OTHER: None. CTA NECK FINDINGS AORTIC ARCH: Normal appearance of the thoracic arch, normal branch pattern. The origins of the innominate, left Common carotid artery  and subclavian artery are widely patent. RIGHT CAROTID SYSTEM: Common carotid artery is widely patent, coursing in a straight line fashion. Normal appearance of the carotid bifurcation without hemodynamically significant stenosis by NASCET criteria, minimal calcific atherosclerosis. Normal appearance of the internal carotid artery. LEFT CAROTID SYSTEM: Common carotid artery is widely patent, coursing in a straight line fashion. Normal appearance of the carotid  bifurcation without hemodynamically significant stenosis by NASCET criteria, minimal calcific atherosclerosis. Normal appearance of the internal carotid artery. VERTEBRAL ARTERIES:Codominant vertebral artery's. Normal appearance of the vertebral arteries, which appear widely patent. SKELETON: No acute osseous process though bone windows have not been submitted. Cervical spondylosis and facet arthropathy. Severe RIGHT C3-4, severe LEFT C4-5 neural foraminal narrowing. OTHER NECK: Soft tissues of the neck are nonacute though, not tailored for evaluation. UPPER CHEST: Included lung apices are clear. No superior mediastinal lymphadenopathy. CTA HEAD FINDINGS ANTERIOR CIRCULATION: Patent cervical internal carotid arteries, petrous, cavernous and supra clinoid internal carotid arteries. Widely patent anterior communicating artery. Patent anterior and middle cerebral arteries, mild luminal irregularity. No large vessel occlusion, significant stenosis, contrast extravasation or aneurysm. POSTERIOR CIRCULATION: Patent vertebral arteries, vertebrobasilar junction and basilar artery, as well as main branch vessels. Patent posterior cerebral arteries. Fetal origin LEFT posterior cerebral artery. Mild luminal irregularity posterior cerebral artery's. Moderate stenosis RIGHT P2 segment. No large vessel occlusion, significant stenosis, contrast extravasation or aneurysm. VENOUS SINUSES: Major dural venous sinuses are patent though not tailored for evaluation on this angiographic examination. ANATOMIC VARIANTS: None. DELAYED PHASE: No abnormal intracranial enhancement. MIP images reviewed. IMPRESSION: CT HEAD: 1. Negative CT HEAD with and without contrast for age. CTA NECK: 1. Minimal atherosclerosis without hemodynamically significant stenosis or acute vascular process. 2. Severe C3-4 and C4-5 neural foraminal narrowing. CTA HEAD: 1. No emergent large vessel occlusion or severe stenosis. 2. Moderate stenosis RIGHT P2 segment. Mild  cerebral artery atherosclerosis. Critical Value/emergent results were called by telephone at the time of interpretation on 09/14/2017 at 1:30 am to Dr. Paula Libra , who verbally acknowledged these results. Electronically Signed   By: Awilda Metro M.D.   On: 09/14/2017 01:36    EKG: Independently reviewed.  Normal sinus rhythm.  Assessment/Plan Principal Problem:   TIA (transient ischemic attack)    1. TIA -patient symptoms are concerning for TIA.  Appreciate neurology consult.  Patient has passed swallow.  MRI brain 2D echo and hemoglobin A1c lipid panel.  Patient is on aspirin. 2. History of ulcerative colitis but off medications since patient's last colonoscopy was normal as per the patient and presently asymptomatic. 3. C3-4 and C4-5 neural foraminal narrowing per neurology.   DVT prophylaxis: Lovenox. Code Status: Full code. Family Communication: Patient's husband. Disposition Plan: Home. Consults called: Neurology. Admission status: Observation.   Eduard Clos MD [facility] Pager [phone].  If 7PM-7AM, please contact night-coverage [email or web address] Password [email or web address]  09/14/2017, 4:26 AM

## 2017-09-14 NOTE — Progress Notes (Signed)
EEG complete - results pending 

## 2017-09-14 NOTE — Progress Notes (Addendum)
STROKE TEAM PROGRESS NOTE   SUBJECTIVE (INTERVAL HISTORY) Her husband is at the bedside.  Overall she feels her condition is completely resolved. Pt explains that last night at work she sat down to the computer and could not comprehend the words on the screen, she had no H/A, no visual changes, +word finding difficulties. The event resolved spontaneously after 15 minutes. She does not remember feeling any weakness, heart racing, abnormal movements or any aura type symptoms. She explains a remote history of PVC's in her 7330's and states that occasionally when she exercises her heart rate goes into SVT but resolves quickly when she performs the valsalva maneuver. She has no identifiable risk factors for stroke but her father died from an MI and stroke when he was 3078.  OBJECTIVE Temp:  [97.8 F (36.6 C)] 97.8 F (36.6 C) (10/26 0024) Pulse Rate:  [62-87] 65 (10/26 1100) Resp:  [11-27] 18 (10/26 1100) BP: (114-169)/(74-94) 143/79 (10/26 1100) SpO2:  [97 %-100 %] 97 % (10/26 1100) Weight:  [47.2 kg (104 lb)] 47.2 kg (104 lb) (10/26 0024)  No results for input(s): GLUCAP in the last 168 hours.  Recent Labs Lab 09/14/17 0018 09/14/17 0508  NA 137  --   K 3.3*  --   CL 101  --   CO2 28  --   GLUCOSE 103*  --   BUN 15  --   CREATININE 0.61 0.60  CALCIUM 9.6  --     Recent Labs Lab 09/14/17 0018  AST 29  ALT 22  ALKPHOS 45  BILITOT 0.7  PROT 7.3  ALBUMIN 5.0    Recent Labs Lab 09/14/17 0018 09/14/17 0508  WBC 7.6 5.7  NEUTROABS 3.2  --   HGB 13.6 13.4  HCT 41.0 39.8  MCV 92.3 90.7  PLT 220 198    Recent Labs Lab 09/14/17 0018  TROPONINI <0.03    Recent Labs  09/14/17 0018  LABPROT 12.4  INR 0.93    Recent Labs  09/14/17 0040  COLORURINE STRAW*  LABSPEC <1.005*  PHURINE 7.0  GLUCOSEU NEGATIVE  HGBUR NEGATIVE  BILIRUBINUR NEGATIVE  KETONESUR NEGATIVE  PROTEINUR NEGATIVE  NITRITE NEGATIVE  LEUKOCYTESUR TRACE*       Component Value Date/Time   CHOL  196 09/14/2017 0508   TRIG 64 09/14/2017 0508   HDL 76 09/14/2017 0508   CHOLHDL 2.6 09/14/2017 0508   VLDL 13 09/14/2017 0508   LDLCALC 107 (H) 09/14/2017 0508   Lab Results  Component Value Date   HGBA1C 5.9 (H) 09/14/2017      Component Value Date/Time   LABOPIA NONE DETECTED 09/14/2017 0040   COCAINSCRNUR NONE DETECTED 09/14/2017 0040   LABBENZ NONE DETECTED 09/14/2017 0040   AMPHETMU NONE DETECTED 09/14/2017 0040   THCU NONE DETECTED 09/14/2017 0040   LABBARB NONE DETECTED 09/14/2017 0040     Recent Labs Lab 09/14/17 0018  ETH <10    I have personally reviewed the radiological images below and agree with the radiology interpretations.  Ct Angio Head W Or Wo Contrast Result Date: 09/14/2017  IMPRESSION: CT HEAD: 1. Negative CT HEAD with and without contrast for age. CTA NECK: 1. Minimal atherosclerosis without hemodynamically significant stenosis or acute vascular process. 2. Severe C3-4 and C4-5 neural foraminal narrowing. CTA HEAD: 1. No emergent large vessel occlusion or severe stenosis. 2. Moderate stenosis RIGHT P2 segment. Mild cerebral artery atherosclerosis.   Ct Angio Neck W And/or Wo Contrast Result Date: 09/14/2017 IMPRESSION: CT HEAD: 1. Negative CT  HEAD with and without contrast for age. CTA NECK: 1. Minimal atherosclerosis without hemodynamically significant stenosis or acute vascular process. 2. Severe C3-4 and C4-5 neural foraminal narrowing. CTA HEAD: 1. No emergent large vessel occlusion or severe stenosis. 2. Moderate stenosis RIGHT P2 segment. Mild cerebral artery atherosclerosis.  Mr Brain Wo Contrast Result Date: 09/14/2017 IMPRESSION: 1. No acute intracranial process. 2. Mild chronic small vessel ischemic disease.   EEG normal EEG  2D Echocardiogram   PENDING  PHYSICAL EXAM  Temp:  [97.8 F (36.6 C)] 97.8 F (36.6 C) (10/26 0024) Pulse Rate:  [62-87] 65 (10/26 1100) Resp:  [11-27] 18 (10/26 1100) BP: (114-169)/(74-94) 143/79 (10/26  1100) SpO2:  [97 %-100 %] 97 % (10/26 1100) Weight:  [47.2 kg (104 lb)] 47.2 kg (104 lb) (10/26 0024)  General - Well nourished, well developed, in no apparent distress Respiratory - Lungs clear bilaterally, No wheezing Cardiovascular - Regular rate and rhythm with no murmur  Mental Status -  Level of arousal and orientation to time, place, and person were intact Language including expression, naming, repetition, comprehension was assessed and found intact. Attention span and concentration were normal Recent and remote memory were intact Fund of Knowledge was assessed and was intact  Cranial Nerves II - XII - II - Visual field intact OU III, IV, VI - Extraocular movements intact V - Facial sensation intact bilaterally VII - Facial movement intact bilaterally VIII - Hearing & vestibular intact bilaterally. X - Palate elevates symmetrically XI - Chin turning & shoulder shrug intact bilaterally XII - Tongue protrusion intact  Motor Strength - The patient's strength was normal in all extremities and pronator drift was absent.  Bulk was normal and fasciculations were absent.   Motor Tone - Muscle tone was assessed at the neck and appendages and was normal  Reflexes - The patient's reflexes were symmetrical in all extremities and she had no pathological reflexes  Sensory - Light touch was symmetrical  Coordination - The patient had normal movements in the hands and feet with no ataxia or dysmetria.  Tremor was absent  Gait and Station - deferred.   ASSESSMENT/PLAN [name] is a 61 y.o. female with a past medical history of ulcerative colitis admitted for sudden onset or word finding and comprehension difficulties.    Likely migraine aura without headache. Pt has no stroke risk factors and stroke work up and EEG negative, TIA or seizure less likely  Resultant - All symptoms resolved spontaneously after 15 minutes.  CTA Head/Neck - Negative CT HEAD   MRI  - No acute  intracranial process  EEG - EEG is normal. normal EEG does not exclude a clinical Dx of epilepsy  2D Echo  -  PENDING  TSH  - 0.947  LDL 107  HgbA1c 5.9  Lovenox for VTE prophylaxis  Diet Heart Room service appropriate? Yes; Fluid consistency: Thin   No antithrombotic prior to admission, now on aspirin 325 mg daily. Continue ASA 81mg  daily at discharge  Patient counseled to be compliant with her antithrombotic medications  Ongoing aggressive stroke risk factor management  Therapy recommendations:  Likely None  Disposition:  HOME, Recommend 30 event monitor to rule out afib. Close follow up with PCP and follow up with Neurology, Darrol Angel in 6 weeks.  Possible hx of migraine with aura  At 20s, pt had one episode of visual aura followed with HA, went to sleep for 2 hours resolved  No more HA episode since  This  episode could be migraine variant - aura only   ? Borderline Hypertension Stable. Range 169-111 / 94-74  BP goal normotensive. Close follow up with PCP, may need low dose B/P medication if continues to have documented episodes of elevated B/P  Hyperlipidemia   Home meds:  None  LDL 107, goal < 100  Pt would like to follow up with PCP for continued cholesterol monitoring  Other Stroke Risk Factors  Family stroke/TIA - Father died at 6 from MI and stroke  Migraine with aura  Other Active Problems  Hypokalemia - Management per primary team  Ulcerative Colitis - stable  Occasional oral Herpes - uses Acyclovir PRN  Hospital day # 0  Brita Romp Stroke Neurology Team 09/14/2017 11:57 AM   I reviewed above note and agree with the assessment and plan. Pt seen and examined. Pt stated that she had sudden onset reading difficulty, word finding difficulty, lasting 15-95min resolved. No HA, weakness or numbness. Not stressed out or panic. Stroke work up negative. She did have one time complicated migraine with aura at her 38s. Her symptoms  could be a migraine variant without HA. No stroke risk factors to suggest TIA this time. EEG normal. Will need continued monitoring over time. Recommend ASA 81mg  and 30 day cardiac event monitoring as out pt. Follow up with PCP for cholesterol control and BP monitoring. LDL goal < 100. Neurology will sign off. Please call with questions. Pt will follow up with Darrol Angel, NP, at [facility] in about 6 weeks. Thanks for the consult.  I spent  35 minutes in total face-to-face time with the patient, more than 50% of which was spent in counseling and coordination of care, reviewing test results, images and medication, and discussing the diagnosis of migraine vs. TIA vs. seizure, treatment plan and potential prognosis. This patient's care requiresreview of multiple databases, neurological assessment, discussion with family, other specialists and medical decision making of high complexity. I had long discussion with pt and her husband at bedside, updated pt current condition, treatment plan and potential prognosis. They expressed understanding and appreciation. Discussed with Lowell Guitar over the phone.    Marvel Plan, MD PhD Stroke Neurology 09/14/2017 3:20 PM     To contact Stroke Continuity provider, please refer to [email or web address]. After hours, contact General Neurology

## 2017-09-14 NOTE — Progress Notes (Signed)
   Order for monitor placed. Our office will call to arrange appt for pickup.

## 2017-09-14 NOTE — Procedures (Signed)
ELECTROENCEPHALOGRAM REPORT  Date of Study: 09/14/2017  Patient's Name: Samantha Matthews MRN: [id number] Date of Birth: Jun 27, 1956  Referring Provider: Marvel PlanJindong Xu, MD  Clinical History: 61 year old woman with sudden onset inability to read and name objects.  Medications: Tylenol ASA Lovenox  Technical Summary: A multichannel digital EEG recording measured by the international 10-20 system with electrodes applied with paste and impedances below 5000 ohms performed in our laboratory with EKG monitoring in an awake and asleep patient.  Hyperventilation was not performed due to possible stroke.  Photic stimulation was performed.  The digital EEG was referentially recorded, reformatted, and digitally filtered in a variety of bipolar and referential montages for optimal display.    Description: The patient is awake and asleep during the recording.  During maximal wakefulness, there is a symmetric, medium voltage 10 Hz posterior dominant rhythm that attenuates with eye opening.  The record is symmetric.  During drowsiness and sleep, there is an increase in theta slowing of the background.  Vertex waves and symmetric sleep spindles were seen.  Photic stimulation did not elicit any abnormalities.  There were no epileptiform discharges or electrographic seizures seen.    EKG lead was unremarkable.  Impression: This awake and asleep EEG is normal.    Clinical Correlation: A normal EEG does not exclude a clinical diagnosis of epilepsy.  If further clinical questions remain, prolonged EEG may be helpful.  Clinical correlation is advised.   Shon MilletAdam Leodan Bolyard, DO

## 2017-09-14 NOTE — Progress Notes (Signed)
  Echocardiogram 2D Echocardiogram has been performed.  Delcie RochENNINGTON, Adetokunbo Mccadden 09/14/2017, 5:22 PM

## 2017-09-14 NOTE — ED Notes (Signed)
Ordered hospital bed 

## 2017-09-14 NOTE — ED Notes (Signed)
Pt ambulated independently to restroom.  Gait steady and even.   

## 2017-09-14 NOTE — ED Notes (Signed)
EDP and Neurologist at bedside  

## 2017-09-14 NOTE — Consult Note (Signed)
Requesting Physician: Dr. Judd Lien    Chief Complaint: Aphasia  History obtained from:    Patient and Chart    HPI:                                                                                                                                       XITLALY AULT is an 61 y.o. female caucasian right-handed with past medical history ulcerative colitis who works as a  Engineer, civil (consulting) employed at [employer] presented with sudden onset inability to read and difficulty naming objects.  The patient states while she was filling out a form she suddenly noticed that she could not read any of the words. After trying for a few minutes she then went to one of her coworkers who noticed that she had difficulty with her speech. She had difficulty naming certain objects - states she was not able to identify Q-tip. Stroke alert was called. Her symptoms lasted about 15 minutes and started to all by the time she went to the CT scanner. She had no weakness, sensory symptoms, visual symptoms, headache, gait imbalance. Upon completion of her CT scan the patient felt should return to her baseline. Patient has had no similar episodes in the past. She did have a fainting spell when she was 17 and an episode of severe headache when she was 20. She denies family history migraines/family members with strokes or young age.   CT head and CT angiogram performed. CT head showed no acute abnormality, CT angiogram showed no significant stenosis/LVO. Moderate right  P2 stenosis was noted. Also incidentally, significant cervical C3-C4 and C4-C5 foraminal narrowing.    Date last known well: 10.26.18 Time last known well: 12 am  tPA Given: No, symptoms resolved NIHSS: 0 Baseline MRS 0     Past Medical History:  Diagnosis Date  . Osteoporosis   . Ulcerative colitis (HCC)     History reviewed. No pertinent surgical history.  History reviewed. No pertinent family history. Father had stroke in his 41s Social History:  reports  that she has quit smoking. She has never used smokeless tobacco. She reports that she does not drink alcohol or use drugs.  Allergies:  Allergies  Allergen Reactions  . Sulfa Antibiotics     Medications:  I reviewed home medications   ROS:                                                                                                                                     14 systems reviewed and negative except above    Examination:                                                                                                      General: Appears well-developed and well-nourished.  Psych: Affect appropriate to situation Eyes: No scleral injection HENT: No OP obstrucion Head: Normocephalic.  Cardiovascular: Normal rate and regular rhythm.  Respiratory: Effort normal and breath sounds normal to anterior ascultation GI: Soft.  No distension. There is no tenderness.  Skin: WDI   Neurological Examination Mental Status: Alert, oriented, thought content appropriate.  Speech fluent without evidence of aphasia.  Able to follow 3 step commands without difficulty. Cranial Nerves: II: Discs flat bilaterally; Visual fields grossly normal,  III,IV, VI: ptosis not present, extra-ocular motions intact bilaterally, pupils equal, round, reactive to light and accommodation V,VII: smile symmetric, facial light touch sensation normal bilaterally VIII: hearing normal bilaterally IX,X: uvula rises symmetrically XI: bilateral shoulder shrug XII: midline tongue extension Motor: Right : Upper extremity   5/5    Left:     Upper extremity   5/5  Lower extremity   5/5     Lower extremity   5/5 Tone and bulk:normal tone throughout; no atrophy noted Sensory: Pinprick and light touch intact throughout, bilaterally Deep Tendon Reflexes: 2+ and symmetric throughout Plantars: Right:  downgoing   Left: downgoing Cerebellar: normal finger-to-nose, normal rapid alternating movements and normal heel-to-shin test Gait: normal gait and station     Lab Results: Basic Metabolic Panel:  Recent Labs Lab 09/14/17 0018  NA 137  K 3.3*  CL 101  CO2 28  GLUCOSE 103*  BUN 15  CREATININE 0.61  CALCIUM 9.6    CBC:  Recent Labs Lab 09/14/17 0018  WBC 7.6  NEUTROABS 3.2  HGB 13.6  HCT 41.0  MCV 92.3  PLT 220    Coagulation Studies:  Recent Labs  09/14/17 0018  LABPROT 12.4  INR 0.93    Imaging: Ct Angio Head W Or Wo Contrast  Result Date: 09/14/2017 CLINICAL DATA:  Code stroke. Periodic confusion and loss of words, now better. EXAM: CT ANGIOGRAPHY HEAD AND NECK TECHNIQUE: Multidetector CT imaging of the head and neck was performed using the standard protocol during bolus  administration of intravenous contrast. Multiplanar CT image reconstructions and MIPs were obtained to evaluate the vascular anatomy. Carotid stenosis measurements (when applicable) are obtained utilizing NASCET criteria, using the distal internal carotid diameter as the denominator. CONTRAST:  100 cc Isovue 370 COMPARISON:  None. FINDINGS: CT HEAD FINDINGS BRAIN: No intraparenchymal hemorrhage, mass effect nor midline shift. The ventricles and sulci are normal for age. Minimal supratentorial white matter hypodensities within normal range for patient's age, though non-specific are most compatible with chronic small vessel ischemic disease. No acute large vascular territory infarcts. No abnormal extra-axial fluid collections. Basal cisterns are patent. VASCULAR: Mild-to-moderate calcific atherosclerosis of the carotid siphons. SKULL: No skull fracture. No significant scalp soft tissue swelling. SINUSES/ORBITS: Small LEFT maxillary mucosal retention cyst. Mastoid air cells are well aerated.The included ocular globes and orbital contents are non-suspicious. OTHER: None. CTA NECK FINDINGS AORTIC ARCH:  Normal appearance of the thoracic arch, normal branch pattern. The origins of the innominate, left Common carotid artery and subclavian artery are widely patent. RIGHT CAROTID SYSTEM: Common carotid artery is widely patent, coursing in a straight line fashion. Normal appearance of the carotid bifurcation without hemodynamically significant stenosis by NASCET criteria, minimal calcific atherosclerosis. Normal appearance of the internal carotid artery. LEFT CAROTID SYSTEM: Common carotid artery is widely patent, coursing in a straight line fashion. Normal appearance of the carotid bifurcation without hemodynamically significant stenosis by NASCET criteria, minimal calcific atherosclerosis. Normal appearance of the internal carotid artery. VERTEBRAL ARTERIES:Codominant vertebral artery's. Normal appearance of the vertebral arteries, which appear widely patent. SKELETON: No acute osseous process though bone windows have not been submitted. Cervical spondylosis and facet arthropathy. Severe RIGHT C3-4, severe LEFT C4-5 neural foraminal narrowing. OTHER NECK: Soft tissues of the neck are nonacute though, not tailored for evaluation. UPPER CHEST: Included lung apices are clear. No superior mediastinal lymphadenopathy. CTA HEAD FINDINGS ANTERIOR CIRCULATION: Patent cervical internal carotid arteries, petrous, cavernous and supra clinoid internal carotid arteries. Widely patent anterior communicating artery. Patent anterior and middle cerebral arteries, mild luminal irregularity. No large vessel occlusion, significant stenosis, contrast extravasation or aneurysm. POSTERIOR CIRCULATION: Patent vertebral arteries, vertebrobasilar junction and basilar artery, as well as main branch vessels. Patent posterior cerebral arteries. Fetal origin LEFT posterior cerebral artery. Mild luminal irregularity posterior cerebral artery's. Moderate stenosis RIGHT P2 segment. No large vessel occlusion, significant stenosis, contrast  extravasation or aneurysm. VENOUS SINUSES: Major dural venous sinuses are patent though not tailored for evaluation on this angiographic examination. ANATOMIC VARIANTS: None. DELAYED PHASE: No abnormal intracranial enhancement. MIP images reviewed. IMPRESSION: CT HEAD: 1. Negative CT HEAD with and without contrast for age. CTA NECK: 1. Minimal atherosclerosis without hemodynamically significant stenosis or acute vascular process. 2. Severe C3-4 and C4-5 neural foraminal narrowing. CTA HEAD: 1. No emergent large vessel occlusion or severe stenosis. 2. Moderate stenosis RIGHT P2 segment. Mild cerebral artery atherosclerosis. Critical Value/emergent results were called by telephone at the time of interpretation on 09/14/2017 at 1:30 am to Dr. Paula LibraJOHN MOLPUS , who verbally acknowledged these results. Electronically Signed   By: Awilda Metroourtnay  Bloomer M.D.   On: 09/14/2017 01:36   Ct Angio Neck W And/or Wo Contrast  Result Date: 09/14/2017 CLINICAL DATA:  Code stroke. Periodic confusion and loss of words, now better. EXAM: CT ANGIOGRAPHY HEAD AND NECK TECHNIQUE: Multidetector CT imaging of the head and neck was performed using the standard protocol during bolus administration of intravenous contrast. Multiplanar CT image reconstructions and MIPs were obtained to evaluate the vascular anatomy. Carotid stenosis measurements (when  applicable) are obtained utilizing NASCET criteria, using the distal internal carotid diameter as the denominator. CONTRAST:  100 cc Isovue 370 COMPARISON:  None. FINDINGS: CT HEAD FINDINGS BRAIN: No intraparenchymal hemorrhage, mass effect nor midline shift. The ventricles and sulci are normal for age. Minimal supratentorial white matter hypodensities within normal range for patient's age, though non-specific are most compatible with chronic small vessel ischemic disease. No acute large vascular territory infarcts. No abnormal extra-axial fluid collections. Basal cisterns are patent. VASCULAR:  Mild-to-moderate calcific atherosclerosis of the carotid siphons. SKULL: No skull fracture. No significant scalp soft tissue swelling. SINUSES/ORBITS: Small LEFT maxillary mucosal retention cyst. Mastoid air cells are well aerated.The included ocular globes and orbital contents are non-suspicious. OTHER: None. CTA NECK FINDINGS AORTIC ARCH: Normal appearance of the thoracic arch, normal branch pattern. The origins of the innominate, left Common carotid artery and subclavian artery are widely patent. RIGHT CAROTID SYSTEM: Common carotid artery is widely patent, coursing in a straight line fashion. Normal appearance of the carotid bifurcation without hemodynamically significant stenosis by NASCET criteria, minimal calcific atherosclerosis. Normal appearance of the internal carotid artery. LEFT CAROTID SYSTEM: Common carotid artery is widely patent, coursing in a straight line fashion. Normal appearance of the carotid bifurcation without hemodynamically significant stenosis by NASCET criteria, minimal calcific atherosclerosis. Normal appearance of the internal carotid artery. VERTEBRAL ARTERIES:Codominant vertebral artery's. Normal appearance of the vertebral arteries, which appear widely patent. SKELETON: No acute osseous process though bone windows have not been submitted. Cervical spondylosis and facet arthropathy. Severe RIGHT C3-4, severe LEFT C4-5 neural foraminal narrowing. OTHER NECK: Soft tissues of the neck are nonacute though, not tailored for evaluation. UPPER CHEST: Included lung apices are clear. No superior mediastinal lymphadenopathy. CTA HEAD FINDINGS ANTERIOR CIRCULATION: Patent cervical internal carotid arteries, petrous, cavernous and supra clinoid internal carotid arteries. Widely patent anterior communicating artery. Patent anterior and middle cerebral arteries, mild luminal irregularity. No large vessel occlusion, significant stenosis, contrast extravasation or aneurysm. POSTERIOR CIRCULATION:  Patent vertebral arteries, vertebrobasilar junction and basilar artery, as well as main branch vessels. Patent posterior cerebral arteries. Fetal origin LEFT posterior cerebral artery. Mild luminal irregularity posterior cerebral artery's. Moderate stenosis RIGHT P2 segment. No large vessel occlusion, significant stenosis, contrast extravasation or aneurysm. VENOUS SINUSES: Major dural venous sinuses are patent though not tailored for evaluation on this angiographic examination. ANATOMIC VARIANTS: None. DELAYED PHASE: No abnormal intracranial enhancement. MIP images reviewed. IMPRESSION: CT HEAD: 1. Negative CT HEAD with and without contrast for age. CTA NECK: 1. Minimal atherosclerosis without hemodynamically significant stenosis or acute vascular process. 2. Severe C3-4 and C4-5 neural foraminal narrowing. CTA HEAD: 1. No emergent large vessel occlusion or severe stenosis. 2. Moderate stenosis RIGHT P2 segment. Mild cerebral artery atherosclerosis. Critical Value/emergent results were called by telephone at the time of interpretation on 09/14/2017 at 1:30 am to Dr. Paula Libra , who verbally acknowledged these results. Electronically Signed   By: Awilda Metro M.D.   On: 09/14/2017 01:36     ASSESSMENT AND PLAN  BESS SALTZMAN is an 61 y.o. female caucasian right-handed with past medical history ulcerative colitis presents with transient episode of Alexia and expressive aphasia lasting for about 15 minutes. Her symptoms completely resolved.  CT angiogram did not really reveal any large vessel occlusion/severe intracranial or carotid stenosis.   Transient ischemic attack  Risk factors: none Etiology: needs evaluation   Recommend # MRI of the brain without contrast #MRA Head and neck  #Transthoracic Echo  # Start patient on  ASA 81mg ,load with 600 mg Plavix today and continue 75mg  daily (initial dual antiplatelet therapy per results of [facility], [facility] trials). Duration of dual antiplatelet treatment  usually 3 weeks to 3 months and will decided by stroke team based on mechanism of stroke.  #Start or continue Atorvastatin 80 mg/other high intensity statin # BP goal: permissive HTN upto 210 systolic, PRNs above 21 # HBAIC and Lipid profile # Telemetry monitoring # Frequent neuro checks # NPO until passes stroke swallow screen  Please page stroke NP  Or  PA  Or MD from 8am -4 pm  as this patient from this time will be  followed by the stroke.   You can look them up on [email or web address]  Password [facility]  Sushanth Aroor [facility] Pager Number [phone]

## 2017-09-14 NOTE — ED Triage Notes (Signed)
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 No slurred speech. amb w/o difficulty

## 2017-09-14 NOTE — ED Notes (Signed)
Pt sitting up eating breakfast.

## 2017-09-14 NOTE — Evaluation (Signed)
Physical Therapy Evaluation Patient Details Name: Samantha Matthews MRN: [id number] DOB: Jan 30, 1956 Today's Date: 09/14/2017   History of Present Illness  Patient is a 61 yo female admitted 09/14/17 as she was finding it difficult to name things and understand words.  This lasted for around 15 minutes.  Code stroke was called.  CT head was unremarkable.  This was followed by CT angiogram of the head and neck which also did not show any large vessel occlusion.  Patient was transferred to [facility] for further TIA/stroke workup.    Clinical Impression  Patient is functioning at independent level with all mobility and gait.  Scored 24/24 on DGI balance assessment indicating no fall risk.  Reviewed signs/symptoms of CVA and to call 911.  No further acute PT needs identified - PT will sign off.  Patient ready for d/c from PT perspective.    Follow Up Recommendations No PT follow up    Equipment Recommendations  None recommended by PT    Recommendations for Other Services       Precautions / Restrictions Precautions Precautions: None Restrictions Weight Bearing Restrictions: No      Mobility  Bed Mobility Overal bed mobility: Independent                Transfers Overall transfer level: Independent Equipment used: None                Ambulation/Gait Ambulation/Gait assistance: Independent Ambulation Distance (Feet): 200 Feet Assistive device: None Gait Pattern/deviations: Step-through pattern   Gait velocity interpretation: at or above normal speed for age/gender General Gait Details: Patient with good gait pattern, balance, and speed.  Stairs            Wheelchair Mobility    Modified Rankin (Stroke Patients Only) Modified Rankin (Stroke Patients Only) Pre-Morbid Rankin Score: No symptoms Modified Rankin: No symptoms     Balance Overall balance assessment: Independent               Single Leg Stance - Right Leg: 30 Single Leg Stance  - Left Leg: 30     Rhomberg - Eyes Opened: 30 Rhomberg - Eyes Closed: 30     Standardized Balance Assessment Standardized Balance Assessment : Dynamic Gait Index   Dynamic Gait Index Level Surface: Normal Change in Gait Speed: Normal Gait with Horizontal Head Turns: Normal Gait with Vertical Head Turns: Normal Gait and Pivot Turn: Normal Step Over Obstacle: Normal Step Around Obstacles: Normal Steps: Normal Total Score: 24       Pertinent Vitals/Pain Pain Assessment: No/denies pain    Home Living Family/patient expects to be discharged to:: Private residence Living Arrangements: Spouse/significant other Available Help at Discharge: Family;Available PRN/intermittently (Husband works) Type of Home: House Home Access: Stairs to enter Entrance Stairs-Rails: None Secretary/administrator of Steps: 1 Home Layout: Two level;Bed/bath upstairs Home Equipment: None      Prior Function Level of Independence: Independent         Comments: Works as Engineer, civil (consulting) at [employer].     Hand Dominance   Dominant Hand: Right    Extremity/Trunk Assessment   Upper Extremity Assessment Upper Extremity Assessment: Overall WFL for tasks assessed    Lower Extremity Assessment Lower Extremity Assessment: Overall WFL for tasks assessed    Cervical / Trunk Assessment Cervical / Trunk Assessment: Normal  Communication   Communication: No difficulties  Cognition Arousal/Alertness: Awake/alert Behavior During Therapy: WFL for tasks assessed/performed Overall Cognitive Status: Within Functional Limits for tasks  assessed                                        General Comments      Exercises     Assessment/Plan    PT Assessment Patent does not need any further PT services  PT Problem List         PT Treatment Interventions      PT Goals (Current goals can be found in the Care Plan section)  Acute Rehab PT Goals Patient Stated Goal: To go home PT Goal  Formulation: All assessment and education complete, DC therapy    Frequency     Barriers to discharge        Co-evaluation               AM-PAC PT "6 Clicks" Daily Activity  Outcome Measure Difficulty turning over in bed (including adjusting bedclothes, sheets and blankets)?: None Difficulty moving from lying on back to sitting on the side of the bed? : None Difficulty sitting down on and standing up from a chair with arms (e.g., wheelchair, bedside commode, etc,.)?: None Help needed moving to and from a bed to chair (including a wheelchair)?: None Help needed walking in hospital room?: None Help needed climbing 3-5 steps with a railing? : None 6 Click Score: 24    End of Session   Activity Tolerance: Patient tolerated treatment well Patient left: in bed;with call bell/phone within reach;with family/visitor present Nurse Communication: Mobility status PT Visit Diagnosis: Other symptoms and signs involving the nervous system (R29.898)    Time: [id number] PT Time Calculation (min) (ACUTE ONLY): 13 min   Charges:   PT Evaluation $PT Eval Low Complexity: 1 Low     PT G Codes:   PT G-Codes **NOT FOR INPATIENT CLASS** Functional Assessment Tool Used: AM-PAC 6 Clicks Basic Mobility Functional Limitation: Mobility: Walking and moving around Mobility: Walking and Moving Around Current Status (Y7829(G8978): 0 percent impaired, limited or restricted Mobility: Walking and Moving Around Goal Status (F6213(G8979): 0 percent impaired, limited or restricted Mobility: Walking and Moving Around Discharge Status (Y8657(G8980): 0 percent impaired, limited or restricted    Durenda HurtSusan H. Renaldo Fiddleravis, PT, [facility] Allied DiseasesMBA Acute Rehab Services Pager [phone]   Vena AustriaSusan H Deliana Avalos 09/14/2017, 4:18 PM

## 2017-09-14 NOTE — ED Provider Notes (Signed)
Patient is a 61 year old female transferred from [facility] for workup of probable TIA.  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  She states that she was reading a label at work when she could not process what she was seeing.  She also had difficulty comprehending what a Q-tip was and what it was for.  She then spoke with the physician on duty there who initiated a code stroke.  She has undergone CT of the head as well as CT angios of the head and neck, all unremarkable with the exception of mild atherosclerotic disease.  She was transferred here for neurology consultation and further workup.  The patient arrived here, she is symptom-free.  Her vitals are stable and is neurologically intact.  She has no complaints at present.  She was evaluated by Dr. Laurence SlateAroor from neurology who would like for her to undergo an MRI and to be admitted to the medicine service for TIA workup.   Samantha Lyonselo, Samantha Muhlestein, MD 09/14/17 [phone]

## 2017-09-14 NOTE — ED Notes (Signed)
Admitting MD at bedside speaking with patient

## 2017-09-17 ENCOUNTER — Telehealth: Payer: Self-pay | Admitting: Physician Assistant

## 2017-09-17 NOTE — Telephone Encounter (Signed)
Daun Peacockrish Trent received request from neurology to arrange 30 day monitor for TEE. Order was already placed by Robbie LisBrittainy Simmons. Rosann Auerbachrish will be contacting office to help arrange. Cayle Cordoba PA-C

## 2017-10-03 ENCOUNTER — Ambulatory Visit: Payer: 59 | Admitting: Family Medicine

## 2017-10-03 DIAGNOSIS — M25512 Pain in left shoulder: Secondary | ICD-10-CM | POA: Diagnosis not present

## 2017-10-03 DIAGNOSIS — G8929 Other chronic pain: Secondary | ICD-10-CM

## 2017-10-03 NOTE — Patient Instructions (Signed)
You have a labral tear of your shoulder. This should respond well to conservative treatment. Try to avoid painful activities (overhead activities, lifting with extended arm) as much as possible. Aleve 2 tabs twice a day with food OR ibuprofen 3 tabs three times a day with food for pain and inflammation only if needed. Can take tylenol in addition to this. Consider physical therapy with transition to home exercise program. Do home exercise program with theraband and scapular stabilization exercises daily 3 sets of 10 once a day. If not improving at follow-up we will consider imaging, injection, physical therapy, and/or nitro patches. Follow up with me in 6 weeks for reevaluation.

## 2017-10-07 ENCOUNTER — Encounter: Payer: Self-pay | Admitting: Family Medicine

## 2017-10-07 DIAGNOSIS — G4700 Insomnia, unspecified: Secondary | ICD-10-CM | POA: Insufficient documentation

## 2017-10-07 DIAGNOSIS — H527 Unspecified disorder of refraction: Secondary | ICD-10-CM | POA: Insufficient documentation

## 2017-10-07 DIAGNOSIS — N812 Incomplete uterovaginal prolapse: Secondary | ICD-10-CM | POA: Insufficient documentation

## 2017-10-07 DIAGNOSIS — M2040 Other hammer toe(s) (acquired), unspecified foot: Secondary | ICD-10-CM | POA: Insufficient documentation

## 2017-10-07 DIAGNOSIS — M25512 Pain in left shoulder: Secondary | ICD-10-CM | POA: Insufficient documentation

## 2017-10-07 DIAGNOSIS — M26609 Unspecified temporomandibular joint disorder, unspecified side: Secondary | ICD-10-CM | POA: Insufficient documentation

## 2017-10-07 NOTE — Assessment & Plan Note (Signed)
exam is reassuring.  Consistent with labral tear.  Has not tried any treatment to date.  She will start with a home exercise program.  Aleve or ibuprofen if needed.  Consider imaging, intraarticular injection, PT, nitro patches if not improving.  F/u in 6 weeks.

## 2017-10-07 NOTE — Progress Notes (Signed)
PCP: Bosie Closice, Kathleen M, MD  Subjective:   HPI: Patient is a 61 y.o. female here for left shoulder pain.  Patient denies known injury or trauma. States for about 6-8 months she's had deep pain in left shoulder worse with reaching and getting things out of a cabinet. Difficulty reaching outwards and back. Pain up to 5/10 and deep ache. No treatment for this yet. She is right handed. No skin changes, numbness.  Past Medical History:  Diagnosis Date  . Osteoporosis   . Ulcerative colitis (HCC)     Current Outpatient Medications on File Prior to Visit  Medication Sig Dispense Refill  . mesalamine (LIALDA) 1.2 g EC tablet Take 2.4 g by mouth.    Marland Kitchen. acyclovir ointment (ZOVIRAX) 5 % Apply topically.    Marland Kitchen. alendronate (FOSAMAX) 70 MG tablet Take 70 mg by mouth once a week. Take with a full glass of water on an empty stomach.    Marland Kitchen. aspirin EC 81 MG tablet Take 1 tablet (81 mg total) by mouth daily. 30 tablet 0  . cholecalciferol (VITAMIN D) 1000 units tablet Take 1,000 Units by mouth once a week.    . Multiple Vitamin (MULTI-VITAMINS) TABS Take by mouth.    . Multiple Vitamin (MULTIVITAMIN WITH MINERALS) TABS tablet Take 1 tablet by mouth once a week.    . valACYclovir (VALTREX) 500 MG tablet Take 1,000 mg by mouth 2 (two) times daily as needed (fever blister).     No current facility-administered medications on file prior to visit.     Past Surgical History:  Procedure Laterality Date  . TUBAL LIGATION      Allergies  Allergen Reactions  . Sulfa Antibiotics Other (See Comments)    High fever, mood changes and felt like death    Social History   Socioeconomic History  . Marital status: Married    Spouse name: Not on file  . Number of children: Not on file  . Years of education: Not on file  . Highest education level: Not on file  Social Needs  . Financial resource strain: Not on file  . Food insecurity - worry: Not on file  . Food insecurity - inability: Not on file  .  Transportation needs - medical: Not on file  . Transportation needs - non-medical: Not on file  Occupational History  . Not on file  Tobacco Use  . Smoking status: Former Games developermoker  . Smokeless tobacco: Never Used  Substance and Sexual Activity  . Alcohol use: No  . Drug use: No  . Sexual activity: Not on file  Other Topics Concern  . Not on file  Social History Narrative  . Not on file    Family History  Problem Relation Age of Onset  . Lupus Mother   . Diabetes Mellitus II Father   . Stroke Father   . Hypertension Father     BP 113/70   Pulse 73   Ht 5\' 2"  (1.575 m)   BMI 19.02 kg/m   Review of Systems: See HPI above.     Objective:  Physical Exam:  Gen: NAD, comfortable in exam room  Left shoulder: No swelling, ecchymoses.  No gross deformity. No TTP. FROM. Negative Hawkins, Neers. Negative Yergasons. Strength 5/5 with empty can and resisted internal/external rotation. Negative apprehension. Mild positive o'briens. NV intact distally.  Right shoulder: No swelling, ecchymoses.  No gross deformity. No TTP. FROM. Strength 5/5 with empty can and resisted internal/external rotation. NV intact distally.  Assessment & Plan:  1. Left shoulder pain - exam is reassuring.  Consistent with labral tear.  Has not tried any treatment to date.  She will start with a home exercise program.  Aleve or ibuprofen if needed.  Consider imaging, intraarticular injection, PT, nitro patches if not improving.  F/u in 6 weeks.

## 2017-10-08 ENCOUNTER — Ambulatory Visit (INDEPENDENT_AMBULATORY_CARE_PROVIDER_SITE_OTHER): Payer: 59

## 2017-10-08 ENCOUNTER — Other Ambulatory Visit: Payer: Self-pay | Admitting: Cardiology

## 2017-10-08 DIAGNOSIS — G459 Transient cerebral ischemic attack, unspecified: Secondary | ICD-10-CM | POA: Diagnosis not present

## 2017-10-08 DIAGNOSIS — I4891 Unspecified atrial fibrillation: Secondary | ICD-10-CM

## 2017-10-08 DIAGNOSIS — R002 Palpitations: Secondary | ICD-10-CM | POA: Diagnosis not present

## 2017-10-09 MED FILL — ALENDRONATE NA 70 MG TAB: 70 | 84 days supply | Qty: 12 | Fill #2

## 2017-11-29 ENCOUNTER — Ambulatory Visit: Payer: Self-pay | Admitting: Neurology

## 2017-12-05 ENCOUNTER — Telehealth: Payer: Self-pay | Admitting: *Deleted

## 2017-12-05 NOTE — Telephone Encounter (Signed)
-----   Message from Anson FretAntonia B Ahern, MD sent at 12/04/2017  1:19 PM EST ----- I would like to refer her back to cardiology for evaluation of the episode of supraventricular tachycardia of up to 190. Dr Roda Shuttersxu recommended this outpatient. Does she have a cardiologist? If not can refer back to [place]. thanks

## 2017-12-05 NOTE — Telephone Encounter (Signed)
Called and spoke with patient. She is aware that her cardiac event monitor showed a tachycardia up to 190. She is aware that Dr. Lucia GaskinsAhern would like to refer her to cardiology. She states she does not have a cardiologist and is aware that the referral will likely be to [facility]. She is in the process of changing insurance and states that she is under the impression that her PCP will need to be the one who makes the referral and that all have to be in-network. She will call her insurance company to confirm and then will call us back. She has not seen her new PCP yet, Dr. Archie Endoandy Smith @ [facility], but should be seeing her soon (appt she had for yesterday is going to be r/s).

## 2017-12-05 NOTE — Telephone Encounter (Signed)
Thanks, I forwarded results to [facility] Smith including imaging, holter, inpatient notes thanks

## 2017-12-13 MED FILL — ZALEPLON 5 MG CAPSULE: 5 | 90 days supply | Qty: 90 | Fill #0

## 2017-12-17 ENCOUNTER — Telehealth: Payer: Self-pay

## 2017-12-17 NOTE — Telephone Encounter (Signed)
SENT REFERRAL TO SCHEDULING 

## 2017-12-24 MED FILL — ALENDRONATE NA 70 MG TAB: 70 | 90 days supply | Qty: 12 | Fill #0

## 2018-01-03 ENCOUNTER — Encounter: Payer: Self-pay | Admitting: Cardiology

## 2018-01-10 ENCOUNTER — Ambulatory Visit: Payer: No Typology Code available for payment source | Admitting: Cardiology

## 2018-01-10 ENCOUNTER — Encounter: Payer: Self-pay | Admitting: Cardiology

## 2018-01-10 VITALS — BP 102/52 | HR 64 | Ht 62.0 in | Wt 111.2 lb

## 2018-01-10 DIAGNOSIS — I351 Nonrheumatic aortic (valve) insufficiency: Secondary | ICD-10-CM

## 2018-01-10 DIAGNOSIS — G43809 Other migraine, not intractable, without status migrainosus: Secondary | ICD-10-CM | POA: Diagnosis not present

## 2018-01-10 DIAGNOSIS — I472 Ventricular tachycardia: Secondary | ICD-10-CM | POA: Diagnosis not present

## 2018-01-10 DIAGNOSIS — E7800 Pure hypercholesterolemia, unspecified: Secondary | ICD-10-CM

## 2018-01-10 DIAGNOSIS — G43819 Other migraine, intractable, without status migrainosus: Secondary | ICD-10-CM

## 2018-01-10 DIAGNOSIS — I493 Ventricular premature depolarization: Secondary | ICD-10-CM | POA: Insufficient documentation

## 2018-01-10 DIAGNOSIS — I471 Supraventricular tachycardia: Secondary | ICD-10-CM | POA: Diagnosis not present

## 2018-01-10 DIAGNOSIS — G43909 Migraine, unspecified, not intractable, without status migrainosus: Secondary | ICD-10-CM | POA: Insufficient documentation

## 2018-01-10 DIAGNOSIS — I4729 Other ventricular tachycardia: Secondary | ICD-10-CM | POA: Insufficient documentation

## 2018-01-10 HISTORY — DX: Nonrheumatic aortic (valve) insufficiency: I35.1

## 2018-01-10 MED ORDER — METOPROLOL TARTRATE 25 MG PO TABS: 12.5000 mg | ORAL_TABLET | ORAL | 3 refills | Status: DC | PRN

## 2018-01-10 MED FILL — METOPROLOL SUCCINATE ER 25: 25 | 60 days supply | Qty: 30 | Fill #0

## 2018-01-10 NOTE — Progress Notes (Signed)
Cardiology Office Note    Date:  01/10/2018   ID:  Samantha Matthews, DOB [birth year], MRN [id number]  PCP:  Merri Brunette, MD  Cardiologist:  Armanda Magic, MD   Chief Complaint  Patient presents with  . New Patient (Initial Visit)    SVT    History of Present Illness:  Samantha Matthews is a 62 y.o. female who is being seen today for the evaluation of SVT at the request of Merri Brunette, MD.  This is a 62yo female with a history of TIA and SVT who is referred for evaluation.  She was hospitalized last fall for a ? TIA vs. Complex migraine  in which she had sudden onset of difficulty with word finding and reading.  MRI of the brain showed small vessel ischemic disease and CT angio of the head and neck showed moderate atherosclerosis of the RP2 segment and mild cerebral artery atherosclerosis.  2D echo was normal.  An outpt event monitor was arranged which showed 5 beats of NSVT and SVT at 190bpm.  No afib was noted.  She was seen by Neuro who felt she had not had a TIA and it was a migraine HA.  She has not had any further neuro sx.    She has a history of SVT throughout the years as well as her brother.  She usually can terminate it with cough or other vagal maneuvers.  It only occurs with heavy exertional activity and never when she runs on her treadmill which she does daily.   Her episode of SVT on the event monitor occurred while shoveling snow.  She says that she can always terminate her SVT with vagal maneuvers and it is not triggered by walking on her treadmill daily or with caffeine.  She denies any chest pain or pressure, SOB, DOE, PND, orthopnea, LE edema, dizziness, palpitations or syncope. She is compliant with her meds and is tolerating meds with no SE.    Past Medical History:  Diagnosis Date  . Aortic insufficiency 01/10/2018   Moderate by echo 08/2017  . Insomnia   . Migraine    complex migraine 08/2017  . Osteoporosis   . PVC's (premature ventricular contractions)    symptomatic in her 30's and then resolved.  4 beats NSVT on monitor 2019 with normal LVF on echo 08/2017  . SVT (supraventricular tachycardia) (HCC)   . Ulcerative colitis [facility])     Past Surgical History:  Procedure Laterality Date  . TUBAL LIGATION      Current Medications: Current Meds  Medication Sig  . alendronate (FOSAMAX) 70 MG tablet Take 70 mg by mouth once a week. Take with a full glass of water on an empty stomach.  Marland Kitchen aspirin EC 81 MG tablet Take 1 tablet (81 mg total) by mouth daily.  . Cholecalciferol (VITAMIN D) 2000 units CAPS Take 2,000 Units by mouth daily.  . Multiple Vitamin (MULTIVITAMIN) tablet Take 1 tablet by mouth daily.  . valACYclovir (VALTREX) 500 MG tablet Take 1,000 mg by mouth 2 (two) times daily as needed (fever blister).    Allergies:   Sulfa antibiotics   Social History   Socioeconomic History  . Marital status: Married    Spouse name: [name]  . Number of children: 2  . Years of education: college  . Highest education level: None  Social Needs  . Financial resource strain: None  . Food insecurity - worry: None  . Food insecurity - inability: None  . Transportation  needs - medical: None  . Transportation needs - non-medical: None  Occupational History  . None  Tobacco Use  . Smoking status: Former Smoker    Last attempt to quit: 01/03/1991    Years since quitting: 27.0  . Smokeless tobacco: Never Used  Substance and Sexual Activity  . Alcohol use: Yes  . Drug use: No  . Sexual activity: None  Other Topics Concern  . None  Social History Narrative  . None     Family History:  The patient's family history includes CVA in her father; Diabetes Mellitus II (age of onset: 7) in her father; Hypertension in her father; Lung cancer (age of onset: 26) in her maternal grandfather; Lupus (age of onset: 69) in her mother; Stroke in her father.   ROS:   Please see the history of present illness.    ROS All other systems reviewed and are  negative.  No flowsheet data found.     PHYSICAL EXAM:   VS:  BP (!) 102/52   Pulse 64   Ht 5\' 2"  (1.575 m)   Wt 111 lb 3.2 oz (50.4 kg)   SpO2 96%   BMI 20.34 kg/m    GEN: Well nourished, well developed, in no acute distress  HEENT: normal  Neck: no JVD, carotid bruits, or masses Cardiac: RRR; no murmurs, rubs, or gallops,no edema.  Intact distal pulses bilaterally.  Respiratory:  clear to auscultation bilaterally, normal work of breathing GI: soft, nontender, nondistended, + BS MS: no deformity or atrophy  Skin: warm and dry, no rash Neuro:  Alert and Oriented x 3, Strength and sensation are intact Psych: euthymic mood, full affect  Wt Readings from Last 3 Encounters:  01/10/18 111 lb 3.2 oz (50.4 kg)  09/14/17 104 lb (47.2 kg)      Studies/Labs Reviewed:   EKG:  EKG is not ordered today.    Recent Labs: 09/14/2017: ALT 22; BUN 15; Creatinine, Ser 0.60; Hemoglobin 13.4; Platelets 198; Potassium 3.3; Sodium 137; TSH 0.947   Lipid Panel    Component Value Date/Time   CHOL 196 09/14/2017 0508   TRIG 64 09/14/2017 0508   HDL 76 09/14/2017 0508   CHOLHDL 2.6 09/14/2017 0508   VLDL 13 09/14/2017 0508   LDLCALC 107 (H) 09/14/2017 0508    Additional studies/ records that were reviewed today include:      ASSESSMENT:    1. SVT (supraventricular tachycardia) (HCC)   2. Pure hypercholesterolemia   3. NSVT (nonsustained ventricular tachycardia) (HCC)   4. Migraine variant   5. PVC's (premature ventricular contractions)   6. Aortic valve insufficiency, etiology of cardiac valve disease unspecified   7. Nonrheumatic aortic valve insufficiency   8. Other migraine without status migrainosus, intractable      PLAN:  In order of problems listed above:  1.  SVT - she had an episode of SVT on event monitor that occurred while out shoveling snow.  She had a history of SVT as well as her brother.  She usually can terminate it with cough or other vagal maneuvers.  It  only occurs with heavy exertional activity and never when she runs on her treadmill which she does daily.   She did not have any evidence of PAF on heart monitor. Since she can easily terminate with vagal maneuvers there is no indication for referral to EP at this time for ablation.  I will give her a Rx for lopressor 25mg  1/2 PRN for breakthrough SVT that does  not terminate with vagal maneuvers.     2.  Hyperlipidmia - LDL goal < 70 given her atherosclerosis of her cerebral arteries.  This is followed by her PCP.   3.  NSVT - she has a long history of PVCs in her 30's that have resolved.  She had a 4 beat run of NSVT on her heart monitor.  Her K+ was 3.2 in the ER in the fall when she had her migraine.  Her TSH was normal.  I will repeat a BMET and Mag.  She has a family history of CAD and was a smoker in the past.  She has evidence of cerebral vascular disease on a head and neck CT so she is at risk for CAD.  I will get a baseline ETT.  She had normal LVF on echo last fall.    4.  Migraine variant - initially felt to be a TIA which prompted the event monitor but Neuro felt it was a complex migraine HA and she has not had any more sx.  No TEE indicated at this time.   5.  Moderate AI - noted on echo 08/2017 with mild AVSC.  She has no h/o rheumatic fever as a child but did have scarlet fever.  I will repeat an echo 08/2018 to look for progression  I will see her back in 1 year if her ETT is normal.   Medication Adjustments/Labs and Tests Ordered: Current medicines are reviewed at length with the patient today.  Concerns regarding medicines are outlined above.  Medication changes, Labs and Tests ordered today are listed in the Patient Instructions below.  There are no Patient Instructions on file for this visit.   Signed, Armanda Magicraci Turner, MD  01/10/2018 3:20 PM    [facility] [address], [place], [place]  [address] Phone: [phone]; Fax: [phone]

## 2018-01-10 NOTE — Patient Instructions (Signed)
Medication Instructions:  Your physician has recommended you make the following change in your medication:  START: lopressor 12.5 mg (1/2 tablet) as needed for palpitations    If you need a refill on your cardiac medications, please contact your pharmacy first.  Labwork: Today for kidney function test and magnesium   Testing/Procedures: Your physician has requested that you have an echocardiogram in October 2019. Echocardiography is a painless test that uses sound waves to create images of your heart. It provides your doctor with information about the size and shape of your heart and how well your heart's chambers and valves are working. This procedure takes approximately one hour. There are no restrictions for this procedure.  Your physician has requested that you have an exercise tolerance test. For further information please visit [email or web address]. Please also follow instruction sheet, as given.   Follow-Up: Your physician wants you to follow-up in: 1 year with Dr. Mayford Knifeurner. You will receive a reminder letter in the mail two months in advance. If you don't receive a letter, please call our office to schedule the follow-up appointment.  Any Other Special Instructions Will Be Listed Below (If Applicable).   Thank you for choosing [facility]    Lyda PeroneRena Bernerd Terhune, RN  [phone]  If you need a refill on your cardiac medications before your next appointment, please call your pharmacy.

## 2018-01-10 NOTE — Progress Notes (Deleted)
Cardiology Office Note:    Date:  01/10/2018   ID:  Samantha Matthews, DOB February 28, 1956, MRN [id number]  PCP:  Merri Brunette, MD  Cardiologist:  No primary care provider on file.    Referring MD: Merri Brunette, MD   No chief complaint on file.   History of Present Illness:    Samantha Matthews is a 62 y.o. female with a hx of ***  Past Medical History:  Diagnosis Date  . Insomnia   . Osteoporosis   . SVT (supraventricular tachycardia) (HCC)   . Ulcerative colitis [facility])     Past Surgical History:  Procedure Laterality Date  . TUBAL LIGATION      Current Medications: Current Meds  Medication Sig  . alendronate (FOSAMAX) 70 MG tablet Take 70 mg by mouth once a week. Take with a full glass of water on an empty stomach.  Marland Kitchen aspirin EC 81 MG tablet Take 1 tablet (81 mg total) by mouth daily.  . Cholecalciferol (VITAMIN D) 2000 units CAPS Take 2,000 Units by mouth daily.  . Multiple Vitamin (MULTIVITAMIN) tablet Take 1 tablet by mouth daily.  . valACYclovir (VALTREX) 500 MG tablet Take 1,000 mg by mouth 2 (two) times daily as needed (fever blister).     Allergies:   Sulfa antibiotics   Social History   Socioeconomic History  . Marital status: Married    Spouse name: [name]  . Number of children: 2  . Years of education: college  . Highest education level: None  Social Needs  . Financial resource strain: None  . Food insecurity - worry: None  . Food insecurity - inability: None  . Transportation needs - medical: None  . Transportation needs - non-medical: None  Occupational History  . None  Tobacco Use  . Smoking status: Former Smoker    Last attempt to quit: 01/03/1991    Years since quitting: 27.0  . Smokeless tobacco: Never Used  Substance and Sexual Activity  . Alcohol use: Yes  . Drug use: No  . Sexual activity: None  Other Topics Concern  . None  Social History Narrative  . None     Family History: The patient's ***family history includes CVA in her father;  Diabetes Mellitus II (age of onset: 66) in her father; Hypertension in her father; Lung cancer (age of onset: 20) in her maternal grandfather; Lupus (age of onset: 3) in her mother; Stroke in her father.  ROS:   Please see the history of present illness.    ROS  All other systems reviewed and negative.   EKGs/Labs/Other Studies Reviewed:    The following studies were reviewed today: ***  EKG:  EKG is *** ordered today.  The ekg ordered today demonstrates ***  Recent Labs: 09/14/2017: ALT 22; BUN 15; Creatinine, Ser 0.60; Hemoglobin 13.4; Platelets 198; Potassium 3.3; Sodium 137; TSH 0.947   Recent Lipid Panel    Component Value Date/Time   CHOL 196 09/14/2017 0508   TRIG 64 09/14/2017 0508   HDL 76 09/14/2017 0508   CHOLHDL 2.6 09/14/2017 0508   VLDL 13 09/14/2017 0508   LDLCALC 107 (H) 09/14/2017 0508    Physical Exam:    VS:  BP (!) 102/52   Pulse 64   Ht 5\' 2"  (1.575 m)   Wt 111 lb 3.2 oz (50.4 kg)   SpO2 96%   BMI 20.34 kg/m     Wt Readings from Last 3 Encounters:  01/10/18 111 lb 3.2 oz (50.4 kg)  09/14/17 104 lb (47.2 kg)     GEN: *** Well nourished, well developed in no acute distress HEENT: Normal NECK: No JVD; No carotid bruits LYMPHATICS: No lymphadenopathy CARDIAC: ***RRR, no murmurs, rubs, gallops RESPIRATORY:  Clear to auscultation without rales, wheezing or rhonchi  ABDOMEN: Soft, non-tender, non-distended MUSCULOSKELETAL:  No edema; No deformity  SKIN: Warm and dry NEUROLOGIC:  Alert and oriented x 3 PSYCHIATRIC:  Normal affect   ASSESSMENT:    No diagnosis found. PLAN:    In order of problems listed above:  ***   Medication Adjustments/Labs and Tests Ordered: Current medicines are reviewed at length with the patient today.  Concerns regarding medicines are outlined above.  No orders of the defined types were placed in this encounter.  No orders of the defined types were placed in this encounter.   Signed, Armanda Magicraci Turner, MD    01/10/2018 2:26 PM    [facility]

## 2018-01-11 LAB — MAGNESIUM: MAGNESIUM: 2.3 mg/dL (ref 1.6–2.3)

## 2018-01-11 LAB — BASIC METABOLIC PANEL
BUN/Creatinine Ratio: 18 (ref 12–28)
BUN: 12 mg/dL (ref 8–27)
CO2: 26 mmol/L (ref 20–29)
CREATININE: 0.65 mg/dL (ref 0.57–1.00)
Calcium: 9.6 mg/dL (ref 8.7–10.3)
Chloride: 103 mmol/L (ref 96–106)
GFR calc Af Amer: 111 mL/min/{1.73_m2} (ref >59)
GFR calc non Af Amer: 96 mL/min/{1.73_m2} (ref >59)
GLUCOSE: 87 mg/dL (ref 65–99)
Potassium: 3.7 mmol/L (ref 3.5–5.2)
Sodium: 144 mmol/L (ref 134–144)

## 2018-01-29 ENCOUNTER — Ambulatory Visit (INDEPENDENT_AMBULATORY_CARE_PROVIDER_SITE_OTHER): Payer: No Typology Code available for payment source

## 2018-01-29 DIAGNOSIS — I493 Ventricular premature depolarization: Secondary | ICD-10-CM | POA: Diagnosis not present

## 2018-01-29 DIAGNOSIS — I4729 Other ventricular tachycardia: Secondary | ICD-10-CM

## 2018-01-29 DIAGNOSIS — I472 Ventricular tachycardia: Secondary | ICD-10-CM

## 2018-01-29 LAB — EXERCISE TOLERANCE TEST
CHL CUP MPHR: 158 {beats}/min
CHL CUP RESTING HR STRESS: 65 {beats}/min
Estimated workload: 13.4 METS
Exercise duration (min): 12 min
Exercise duration (sec): 0 s
Peak HR: 153 {beats}/min
Percent HR: 96 %
RPE: 15

## 2018-04-01 MED FILL — ALENDRONATE NA 70 MG TAB: 70 | 90 days supply | Qty: 12 | Fill #1

## 2018-07-04 MED FILL — ALENDRONATE NA 70 MG TAB: 70 | 90 days supply | Qty: 12 | Fill #2

## 2018-09-03 ENCOUNTER — Other Ambulatory Visit (HOSPITAL_BASED_OUTPATIENT_CLINIC_OR_DEPARTMENT_OTHER): Payer: Self-pay | Admitting: Family Medicine

## 2018-09-03 DIAGNOSIS — Z1239 Encounter for other screening for malignant neoplasm of breast: Secondary | ICD-10-CM

## 2018-09-10 ENCOUNTER — Other Ambulatory Visit (HOSPITAL_COMMUNITY): Payer: No Typology Code available for payment source

## 2018-09-11 ENCOUNTER — Ambulatory Visit (HOSPITAL_COMMUNITY): Payer: No Typology Code available for payment source | Attending: Internal Medicine

## 2018-09-11 ENCOUNTER — Encounter: Payer: Self-pay | Admitting: Cardiology

## 2018-09-11 ENCOUNTER — Other Ambulatory Visit: Payer: Self-pay

## 2018-09-11 DIAGNOSIS — I351 Nonrheumatic aortic (valve) insufficiency: Secondary | ICD-10-CM | POA: Insufficient documentation

## 2018-09-13 ENCOUNTER — Telehealth: Payer: Self-pay

## 2018-09-13 DIAGNOSIS — I351 Nonrheumatic aortic (valve) insufficiency: Secondary | ICD-10-CM

## 2018-09-13 NOTE — Telephone Encounter (Signed)
Notes recorded by Quintella Reichert, MD on 09/11/2018 at 8:27 PM EDT Echo showed normal LVF with EF 60% and mildly thickened AV with mild to moderate AR - repeat echo in 1 year to make sure AI remains stable.

## 2018-09-13 NOTE — Telephone Encounter (Signed)
The patient has been notified of the result and verbalized understanding.  All questions (if any) were answered. Order placed for repeat echo in 1 year. Dustin Flock, RN 09/13/2018 3:02 PM

## 2018-09-16 ENCOUNTER — Ambulatory Visit (HOSPITAL_BASED_OUTPATIENT_CLINIC_OR_DEPARTMENT_OTHER)
Admission: RE | Admit: 2018-09-16 | Discharge: 2018-09-16 | Disposition: A | Discharge status: Home / Self Care | Payer: No Typology Code available for payment source | Source: Ambulatory Visit | Attending: Family Medicine | Admitting: Family Medicine

## 2018-09-16 DIAGNOSIS — Z1239 Encounter for other screening for malignant neoplasm of breast: Secondary | ICD-10-CM | POA: Diagnosis not present

## 2018-09-19 ENCOUNTER — Other Ambulatory Visit (HOSPITAL_BASED_OUTPATIENT_CLINIC_OR_DEPARTMENT_OTHER): Payer: Self-pay | Admitting: Family Medicine

## 2018-09-19 ENCOUNTER — Other Ambulatory Visit: Payer: Self-pay | Admitting: Family Medicine

## 2018-09-19 DIAGNOSIS — Z780 Asymptomatic menopausal state: Secondary | ICD-10-CM

## 2018-09-20 ENCOUNTER — Ambulatory Visit (HOSPITAL_BASED_OUTPATIENT_CLINIC_OR_DEPARTMENT_OTHER)
Admission: RE | Admit: 2018-09-20 | Discharge: 2018-09-20 | Disposition: A | Discharge status: Home / Self Care | Payer: No Typology Code available for payment source | Source: Ambulatory Visit | Attending: Family Medicine | Admitting: Family Medicine

## 2018-09-20 DIAGNOSIS — M810 Age-related osteoporosis without current pathological fracture: Secondary | ICD-10-CM | POA: Insufficient documentation

## 2018-09-20 DIAGNOSIS — Z780 Asymptomatic menopausal state: Secondary | ICD-10-CM | POA: Insufficient documentation

## 2018-09-23 MED FILL — ALENDRONATE NA 70 MG TAB: 70 | 84 days supply | Qty: 12 | Fill #3

## 2018-09-25 ENCOUNTER — Ambulatory Visit: Payer: No Typology Code available for payment source | Admitting: Physical Therapy

## 2019-05-05 MED FILL — PREVIDENT 5000 SENSITIVE PA: 1.1-5 | 30 days supply | Qty: 100 | Fill #0

## 2019-08-09 IMAGING — MG DIGITAL SCREENING BILATERAL MAMMOGRAM WITH TOMO AND CAD
8 series · 8 of 24 positions shown · non-contrast
Comparison: Previous exam(s).

CLINICAL DATA: Screening.

EXAM:
DIGITAL SCREENING BILATERAL MAMMOGRAM WITH TOMO AND CAD

[L CC synth-2D]
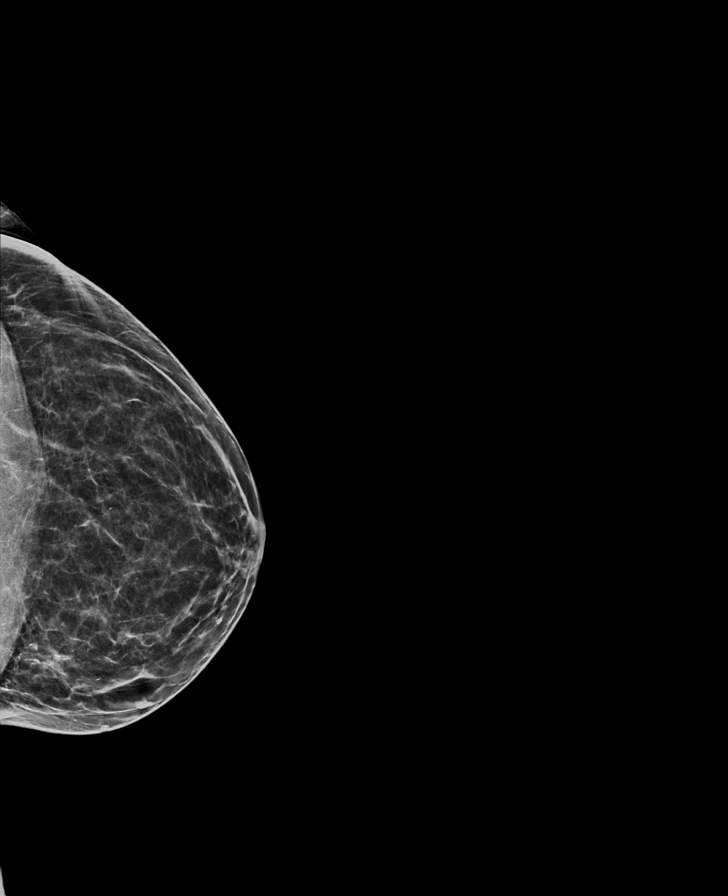

[L MLO synth-2D]
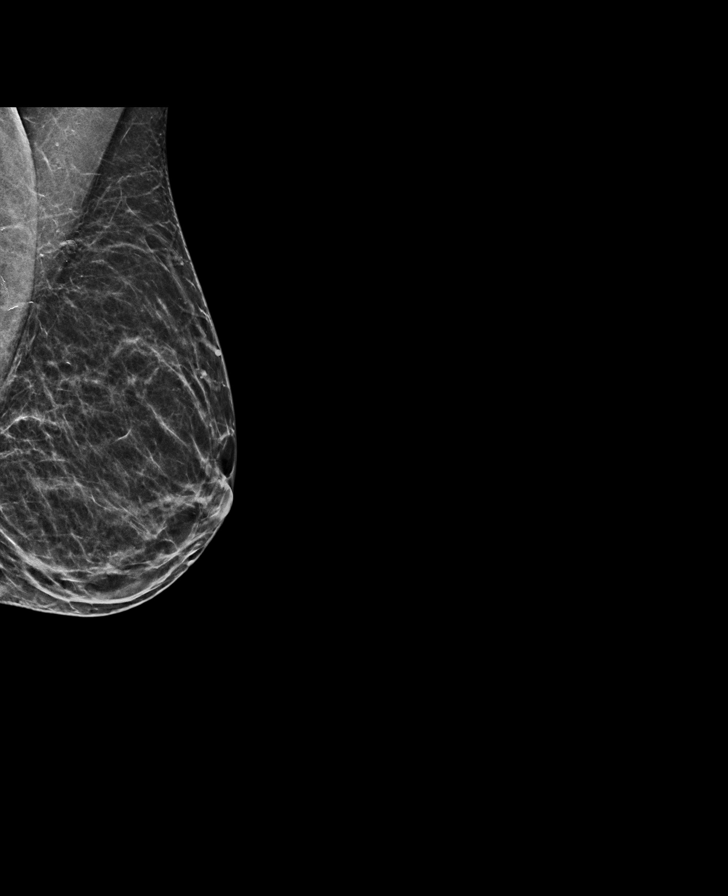

[R MLO synth-2D]
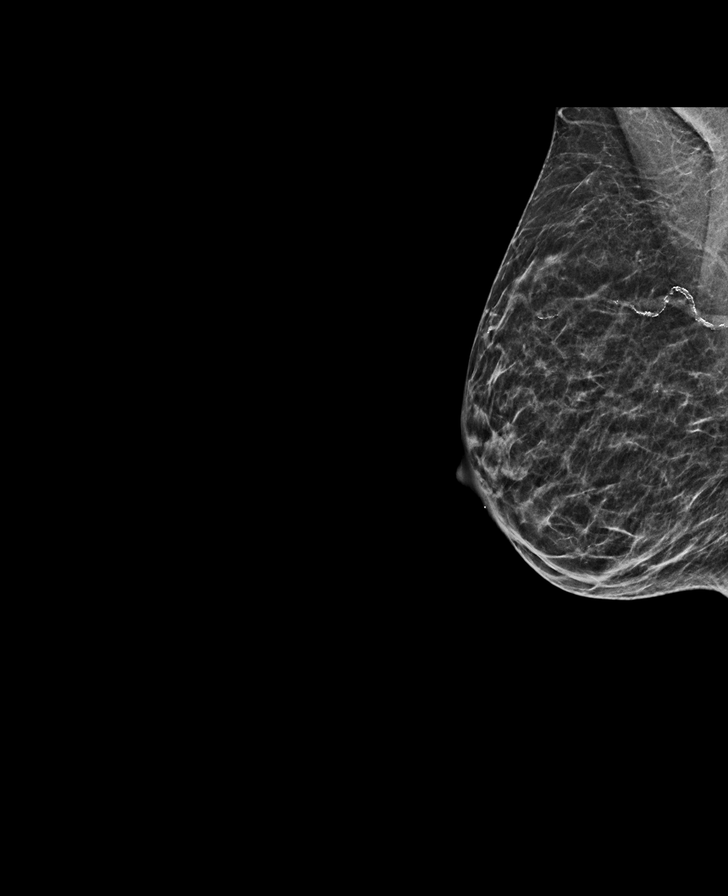

[R CC synth-2D]
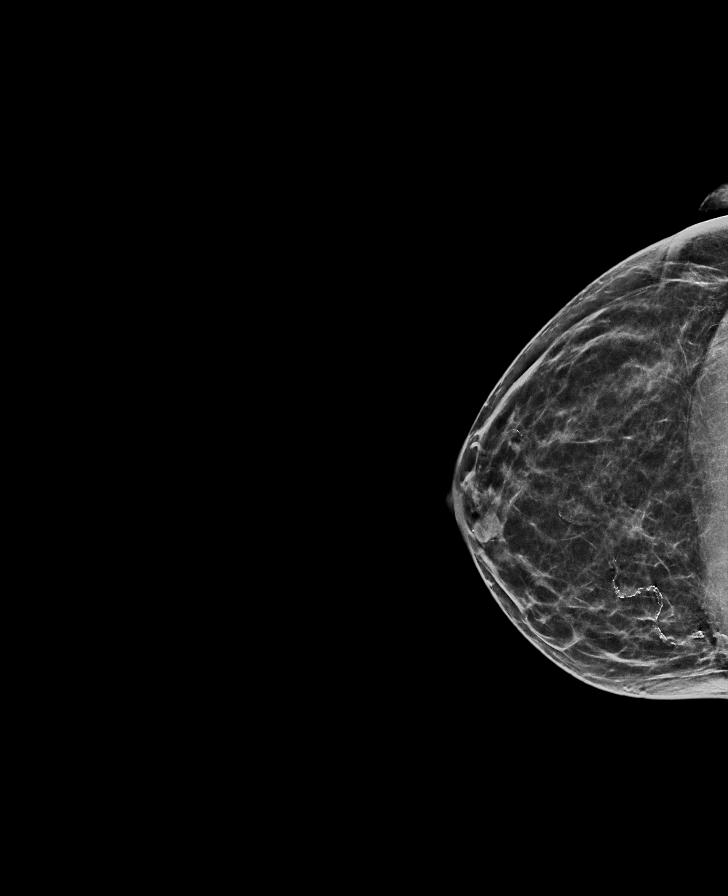

[R CC tomo · tomo slice 27/54.0]
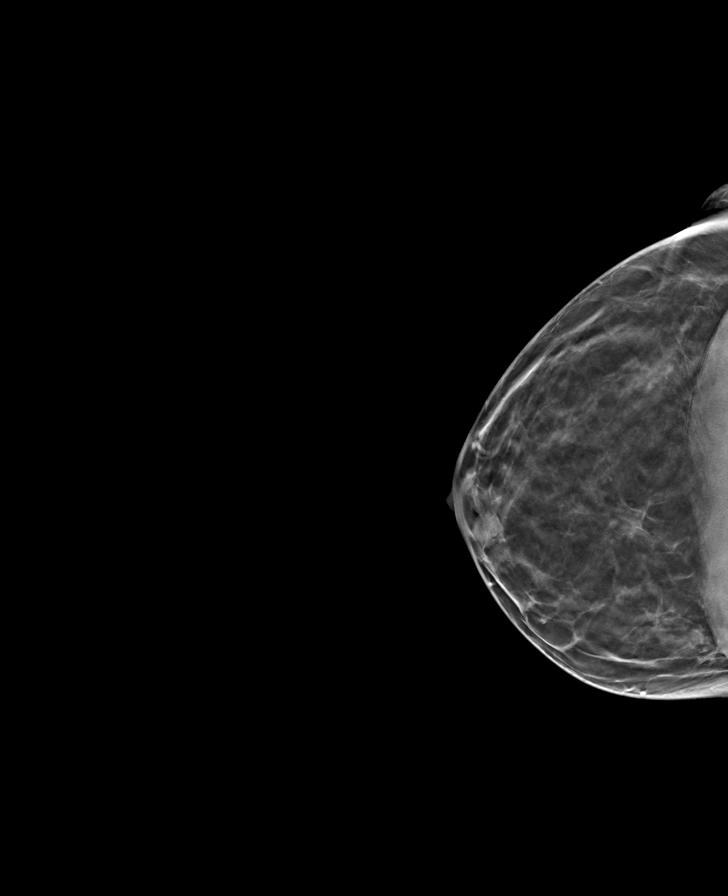

[R MLO tomo · tomo slice 23/46.0]
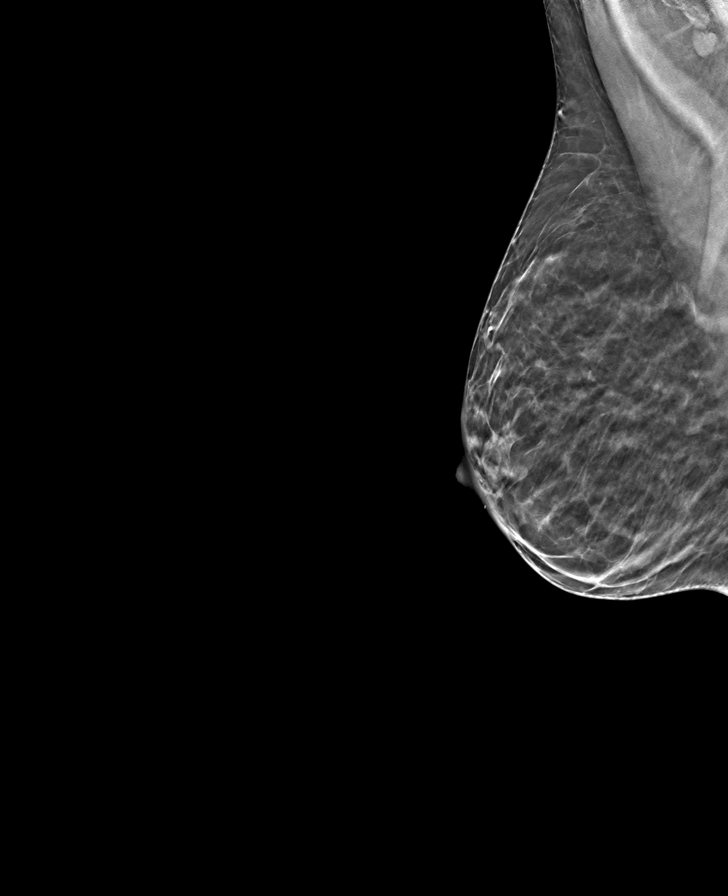

[L CC tomo · tomo slice 27/54.0]
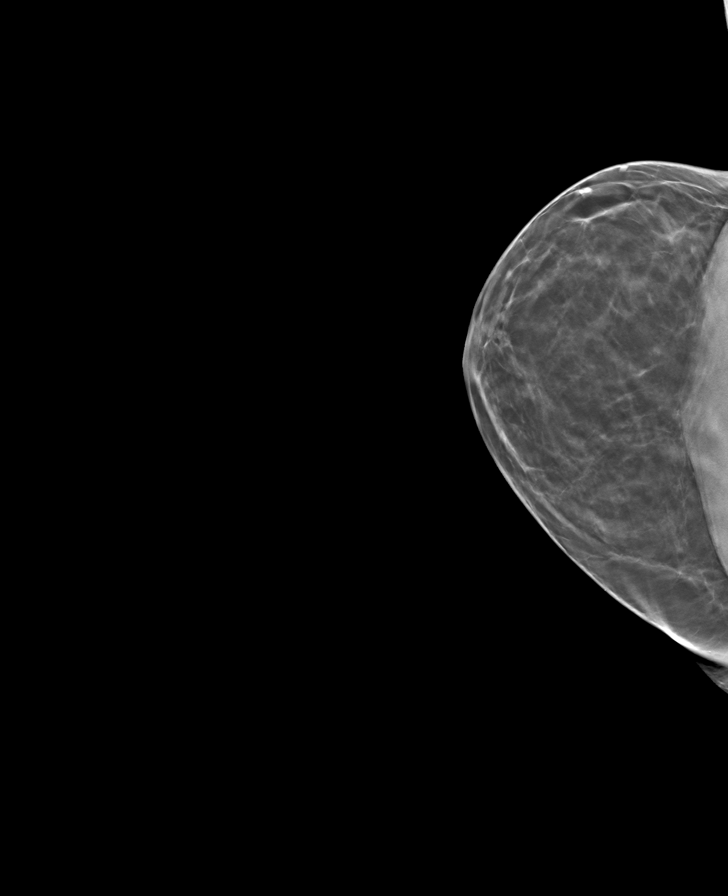

[L MLO tomo · tomo slice 25/50.0]
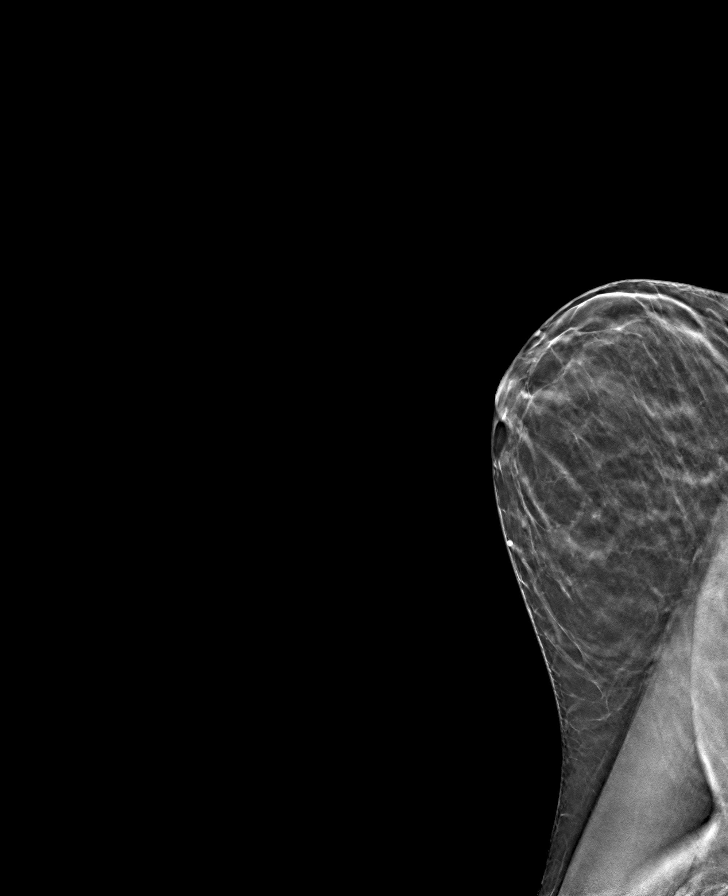

[8 of 24 positions shown; findings below may reference images not displayed]

ACR Breast Density Category b: There are scattered areas of
fibroglandular density.
FINDINGS: There are no findings suspicious for malignancy. Images were
processed with CAD.
IMPRESSION: No mammographic evidence of malignancy. A result letter of this
screening mammogram will be mailed directly to the patient.

RECOMMENDATION:
Screening mammogram in one year. (Code:[id number])

BI-RADS CATEGORY  1: Negative.

## 2019-09-12 ENCOUNTER — Other Ambulatory Visit (HOSPITAL_BASED_OUTPATIENT_CLINIC_OR_DEPARTMENT_OTHER): Payer: Self-pay | Admitting: Family Medicine

## 2019-09-12 DIAGNOSIS — Z1231 Encounter for screening mammogram for malignant neoplasm of breast: Secondary | ICD-10-CM

## 2019-09-22 ENCOUNTER — Other Ambulatory Visit (HOSPITAL_BASED_OUTPATIENT_CLINIC_OR_DEPARTMENT_OTHER): Payer: Self-pay | Admitting: Radiology

## 2019-09-22 ENCOUNTER — Ambulatory Visit (HOSPITAL_BASED_OUTPATIENT_CLINIC_OR_DEPARTMENT_OTHER): Payer: No Typology Code available for payment source

## 2019-09-23 ENCOUNTER — Ambulatory Visit (HOSPITAL_BASED_OUTPATIENT_CLINIC_OR_DEPARTMENT_OTHER)
Admission: RE | Admit: 2019-09-23 | Discharge: 2019-09-23 | Disposition: A | Discharge status: Home / Self Care | Payer: No Typology Code available for payment source | Source: Ambulatory Visit | Attending: Family Medicine | Admitting: Family Medicine

## 2019-09-23 ENCOUNTER — Other Ambulatory Visit: Payer: Self-pay

## 2019-09-23 DIAGNOSIS — Z1231 Encounter for screening mammogram for malignant neoplasm of breast: Secondary | ICD-10-CM | POA: Insufficient documentation

## 2020-01-26 MED FILL — ZALEPLON 5 MG CAPS: 5 | 90 days supply | Qty: 90 | Fill #0

## 2020-02-04 MED FILL — ZALEPLON 5 MG CAPS: 5 | 90 days supply | Qty: 90 | Fill #0

## 2020-02-12 ENCOUNTER — Ambulatory Visit: Payer: No Typology Code available for payment source | Admitting: Pediatrics

## 2020-02-12 ENCOUNTER — Other Ambulatory Visit: Payer: Self-pay

## 2020-02-12 ENCOUNTER — Encounter: Payer: Self-pay | Admitting: Pediatrics

## 2020-02-12 VITALS — BP 106/60 | HR 70 | Temp 98.1°F | Resp 16 | Ht 63.0 in | Wt 101.9 lb

## 2020-02-12 DIAGNOSIS — J310 Chronic rhinitis: Secondary | ICD-10-CM

## 2020-02-12 DIAGNOSIS — T63441D Toxic effect of venom of bees, accidental (unintentional), subsequent encounter: Secondary | ICD-10-CM | POA: Diagnosis not present

## 2020-02-12 DIAGNOSIS — J3089 Other allergic rhinitis: Secondary | ICD-10-CM

## 2020-02-12 DIAGNOSIS — M8589 Other specified disorders of bone density and structure, multiple sites: Secondary | ICD-10-CM | POA: Diagnosis not present

## 2020-02-12 DIAGNOSIS — Z8719 Personal history of other diseases of the digestive system: Secondary | ICD-10-CM

## 2020-02-12 DIAGNOSIS — Z87898 Personal history of other specified conditions: Secondary | ICD-10-CM

## 2020-02-12 MED ORDER — IPRATROPIUM BROMIDE 0.03 % NA SOLN: NASAL | 5 refills | Status: AC

## 2020-02-12 MED ORDER — EPINEPHRINE 0.3 MG/0.3ML IJ SOAJ: INTRAMUSCULAR | 1 refills | Status: AC

## 2020-02-12 MED ORDER — AZELASTINE HCL 0.1 % NA SOLN: NASAL | 5 refills | Status: AC

## 2020-02-12 MED FILL — IPRATROPIUM 0.03% SPRAY: 0.03 | 30 days supply | Qty: 30 | Fill #0

## 2020-02-12 MED FILL — AZELASTINE HCL 137 MCG SPRY: 0.1 | 25 days supply | Qty: 30 | Fill #0

## 2020-02-12 NOTE — Patient Instructions (Addendum)
Environmental control of dust and mold Claritin 10 mg-take 1 tablet once a day if needed for runny nose or instead  azelastine 0.1% - 2 sprays per nostril twice a day Fluticasone 1 spray per nostril twice a day if needed for stuffy nose Ipratropium 0.03% nasal spray-use 2 sprays per nostril 15 minutes before eating.  You may use 2 sprays per nostril 3 times a day for runny nose Continue on your other medications  Call us if you are not doing well on this treatment plan  I will let you know the results of your blood work for insect allergy.  Take Benadryl 50 mg every 6 hours if stung , and use Auvi-Q 0.3 mg for life-threatening symptoms

## 2020-02-12 NOTE — Progress Notes (Signed)
[address] [place] [address] Dept: [phone]  New Patient Note  Patient ID: Samantha Matthews, female    DOB: May 07, 1956  Age: 64 y.o. MRN: [id number] Date of Office Visit: 02/12/2020 Referring provider: Merri Brunette, MD [phone] [address] [place],  [place] [address]    Chief Complaint: Allergic Rhinitis  (nose runs all the time most consistantly before she eats.)  HPI ADELEIGH BARLETTA presents for for an allergy evaluation.  She has had a runny nose for about 20 years.  Her symptoms are perennial.  There are no clear-cut precipitants to her runny nose.  Often the runny nose begins as she starts eating.  She does not have sinus headaches.  She has not had sinus infections or pneumonia.  She has never had asthmatic symptoms, gastroesophageal reflux, eczema or chronic hives.  She used to have tachycardia but that  is no longer a problem.  About 15 years ago following an insect sting she developed generalized itching and took Benadryl.  She has not had cardiorespiratory symptoms from insect stings  She has osteopenia  and takes Prolia  Review of Systems  Constitutional: Negative.   HENT:       Runny nose for 20 years  Eyes: Negative.   Respiratory: Negative.   Cardiovascular:       History of tachycardia but no longer a problem  Gastrointestinal:       History of ulcerative colitis now in remission  Genitourinary: Negative.   Musculoskeletal: Negative.   Skin:       15  years ago she had generalized itching from an insect sting.  She took Benadryl to control her symptoms  Neurological:       History of 1 migraine headache with aura  Endo/Heme/Allergies:       No diabetes or thyroid disease.  Osteopenia   Psychiatric/Behavioral: Negative.     Outpatient Encounter Medications as of 02/12/2020  Medication Sig  . denosumab (PROLIA) 60 MG/ML SOSY injection Inject 60 mg into the skin every 6 (six) months.  . Multiple Vitamin (MULTIVITAMIN) tablet Take 1 tablet by  mouth daily.  . valACYclovir (VALTREX) 500 MG tablet Take 1,000 mg by mouth 2 (two) times daily as needed (fever blister).  . zaleplon (SONATA) 5 MG capsule Take 5 mg by mouth at bedtime.  Marland Kitchen azelastine (ASTELIN) 0.1 % nasal spray 2 sprays per nostril twice a day.  Marland Kitchen EPINEPHrine (AUVI-Q) 0.3 mg/0.3 mL IJ SOAJ injection Use as directed for severe allergic reactions.  Marland Kitchen ipratropium (ATROVENT) 0.03 % nasal spray Use 2 sprays per nostril 15 minutes before eating.  You may use 2 sprays per nostril 3 times a day for runny nose.  . [DISCONTINUED] alendronate (FOSAMAX) 70 MG tablet Take 70 mg by mouth once a week. Take with a full glass of water on an empty stomach.  . [DISCONTINUED] Cholecalciferol (VITAMIN D) 2000 units CAPS Take 2,000 Units by mouth daily.  . [DISCONTINUED] metoprolol tartrate (LOPRESSOR) 25 MG tablet Take 0.5 tablets (12.5 mg total) by mouth as needed (as needed for palpitations).   No facility-administered encounter medications on file as of 02/12/2020.     Drug Allergies:  Allergies  Allergen Reactions  . Sulfa Antibiotics Other (See Comments)    High fever, mood changes and felt like death    Family History: Yee's family history includes CVA in her father; Diabetes Mellitus II (age of onset: 73) in her father; Hypertension in her father; Lung cancer (age  of onset: 91) in her maternal grandfather; Lupus (age of onset: 4) in her mother; Stroke in her father..  Family history is positive for hayfever in her father and lupus in her mother.  Family history history is negative for asthma, sinus problems, angioedema, eczema, hives, food allergies, chronic bronchitis or emphysema.  Social and environmental.  She is not exposed to cigarette smoking.  She smoked cigarettes for 20 years averaging 1 pack/day.  She quit smoking cigarettes 28 years ago.  She is a Marine scientist in an emergency room.  Physical Exam: BP 106/60 (BP Location: Left Arm, Patient Position: Sitting, Cuff Size: Small)    Pulse 70   Temp 98.1 F (36.7 C) (Oral)   Resp 16   Ht 5\' 3"  (1.6 m)   Wt 101 lb 13.6 oz (46.2 kg)   SpO2 100%   BMI 18.04 kg/m    Physical Exam Vitals reviewed.  Constitutional:      Appearance: Normal appearance. She is normal weight.  HENT:     Head:     Comments: Eyes normal.  Ears normal.  Nose normal.  Pharynx normal. Neck:     Comments: No thyromegaly Cardiovascular:     Rate and Rhythm: Normal rate and regular rhythm.     Comments: S1-S2 normal no murmurs Pulmonary:     Comments: Clear to percussion and auscultation Abdominal:     Palpations: Abdomen is soft.     Tenderness: There is no abdominal tenderness.     Comments: No hepatosplenomegaly  Musculoskeletal:     Cervical back: Neck supple.  Lymphadenopathy:     Cervical: No cervical adenopathy.  Neurological:     General: No focal deficit present.     Mental Status: She is alert and oriented to person, place, and time. Mental status is at baseline.  Psychiatric:        Mood and Affect: Mood normal.        Behavior: Behavior normal.        Thought Content: Thought content normal.        Judgment: Judgment normal.     Diagnostics: Allergy skin test were positive to some molds on intradermal testing only   Assessment  Assessment and Plan: 1. Toxic effect of venom of bees, unintentional, subsequent encounter   2. Other allergic rhinitis   3. Osteopenia of multiple sites   4. History of ulcerative colitis   5. History of tachycardia   6. Gustatory rhinitis     Meds ordered this encounter  Medications  . EPINEPHrine (AUVI-Q) 0.3 mg/0.3 mL IJ SOAJ injection    Sig: Use as directed for severe allergic reactions.    Dispense:  2 each    Refill:  1  . azelastine (ASTELIN) 0.1 % nasal spray    Sig: 2 sprays per nostril twice a day.    Dispense:  30 mL    Refill:  5  . ipratropium (ATROVENT) 0.03 % nasal spray    Sig: Use 2 sprays per nostril 15 minutes before eating.  You may use 2 sprays per  nostril 3 times a day for runny nose.    Dispense:  30 mL    Refill:  5    Patient Instructions  Environmental control of dust and mold Claritin 10 mg-take 1 tablet once a day if needed for runny nose or instead  azelastine 0.1% - 2 sprays per nostril twice a day Fluticasone 1 spray per nostril twice a day if needed for stuffy nose  Ipratropium 0.03% nasal spray-use 2 sprays per nostril 15 minutes before eating.  You may use 2 sprays per nostril 3 times a day for runny nose Continue on your other medications  Call us if you are not doing well on this treatment plan  I will let you know the results of your blood work for insect allergy.  Take Benadryl 50 mg every 6 hours if stung , and use Auvi-Q 0.3 mg for life-threatening symptoms   Return in about 4 weeks (around 03/11/2020).   Thank you for the opportunity to care for this patient.  Please do not hesitate to contact me with questions.  Tonette Bihari, M.D.  [facility] [address] [place], [place] [address] [phone]

## 2020-02-14 LAB — ALLERGEN HYMENOPTERA PANEL
Bumblebee: <0.1 kU/L
Honeybee IgE: <0.1 kU/L
Hornet, White Face, IgE: <0.1 kU/L
Hornet, Yellow, IgE: <0.1 kU/L
Paper Wasp IgE: <0.1 kU/L
Yellow Jacket, IgE: <0.1 kU/L

## 2020-02-17 ENCOUNTER — Telehealth: Payer: Self-pay | Admitting: Pediatrics

## 2020-02-17 NOTE — Telephone Encounter (Signed)
Patient states she missed call and is wanting to discuss ab results

## 2020-02-17 NOTE — Telephone Encounter (Signed)
Pt informed of lab results to venoms.

## 2020-02-26 MED FILL — valACYclovir HCL 1 GM TABS: 1 | 7 days supply | Qty: 30 | Fill #0

## 2020-08-24 ENCOUNTER — Other Ambulatory Visit (HOSPITAL_BASED_OUTPATIENT_CLINIC_OR_DEPARTMENT_OTHER): Payer: Self-pay | Admitting: Internal Medicine

## 2020-08-24 MED FILL — FLUARIX QUADRIVALENT 0.5 ML: 0.5 | 1 days supply | Qty: 1 | Fill #0

## 2024-06-01 ENCOUNTER — Other Ambulatory Visit: Payer: Self-pay | Admitting: Medical Genetics

## 2024-06-11 ENCOUNTER — Other Ambulatory Visit (HOSPITAL_COMMUNITY)
Admission: RE | Admit: 2024-06-11 | Discharge: 2024-06-11 | Disposition: A | Discharge status: Home / Self Care | Payer: Self-pay | Source: Ambulatory Visit | Attending: Medical Genetics | Admitting: Medical Genetics

## 2024-06-20 LAB — GENECONNECT MOLECULAR SCREEN: Genetic Analysis Overall Interpretation: NEGATIVE
